# Patient Record
Sex: Male | Born: 1980 | Race: Black or African American | Hispanic: No | Marital: Single | Smoking: Former smoker
Health system: Southern US, Community
[De-identification: ages and names within clinical notes are randomized; demographics above are authoritative.]

## PROBLEM LIST (undated history)

## (undated) DIAGNOSIS — F1290 Cannabis use, unspecified, uncomplicated: Secondary | ICD-10-CM

## (undated) DIAGNOSIS — Z720 Tobacco use: Secondary | ICD-10-CM

## (undated) DIAGNOSIS — Z87891 Personal history of nicotine dependence: Secondary | ICD-10-CM

## (undated) DIAGNOSIS — I10 Essential (primary) hypertension: Secondary | ICD-10-CM

## (undated) HISTORY — PX: WISDOM TOOTH EXTRACTION: SHX21

## (undated) HISTORY — DX: Personal history of nicotine dependence: Z87.891

## (undated) HISTORY — DX: Tobacco use: Z72.0

## (undated) HISTORY — DX: Cannabis use, unspecified, uncomplicated: F12.90

---
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
## 2000-12-24 ENCOUNTER — Emergency Department (HOSPITAL_COMMUNITY): Admission: EM | Admit: 2000-12-24 | Discharge: 2000-12-24 | Payer: Self-pay | Admitting: Emergency Medicine

## 2000-12-24 ENCOUNTER — Encounter: Payer: Self-pay | Admitting: Emergency Medicine

## 2002-02-03 ENCOUNTER — Emergency Department (HOSPITAL_COMMUNITY): Admission: EM | Admit: 2002-02-03 | Discharge: 2002-02-03 | Payer: Self-pay | Admitting: Emergency Medicine

## 2004-02-03 ENCOUNTER — Emergency Department (HOSPITAL_COMMUNITY): Admission: EM | Admit: 2004-02-03 | Discharge: 2004-02-03 | Payer: Self-pay | Admitting: *Deleted

## 2004-02-03 ENCOUNTER — Emergency Department (HOSPITAL_COMMUNITY): Admission: EM | Admit: 2004-02-03 | Discharge: 2004-02-03 | Payer: Self-pay | Admitting: Emergency Medicine

## 2004-07-31 ENCOUNTER — Emergency Department (HOSPITAL_COMMUNITY): Admission: EM | Admit: 2004-07-31 | Discharge: 2004-07-31 | Payer: Self-pay | Admitting: Emergency Medicine

## 2004-12-28 ENCOUNTER — Emergency Department (HOSPITAL_COMMUNITY): Admission: EM | Admit: 2004-12-28 | Discharge: 2004-12-28 | Payer: Self-pay | Admitting: Emergency Medicine

## 2006-04-09 ENCOUNTER — Emergency Department (HOSPITAL_COMMUNITY): Admission: EM | Admit: 2006-04-09 | Discharge: 2006-04-09 | Payer: Self-pay | Admitting: Emergency Medicine

## 2006-04-10 ENCOUNTER — Emergency Department (HOSPITAL_COMMUNITY): Admission: EM | Admit: 2006-04-10 | Discharge: 2006-04-10 | Payer: Self-pay | Admitting: Emergency Medicine

## 2006-11-14 ENCOUNTER — Emergency Department (HOSPITAL_COMMUNITY): Admission: EM | Admit: 2006-11-14 | Discharge: 2006-11-14 | Payer: Self-pay | Admitting: Emergency Medicine

## 2010-04-14 ENCOUNTER — Emergency Department (HOSPITAL_COMMUNITY)
Admission: EM | Admit: 2010-04-14 | Discharge: 2010-04-14 | Payer: Self-pay | Source: Home / Self Care | Admitting: Emergency Medicine

## 2010-06-13 ENCOUNTER — Inpatient Hospital Stay (INDEPENDENT_AMBULATORY_CARE_PROVIDER_SITE_OTHER)
Admission: RE | Admit: 2010-06-13 | Discharge: 2010-06-13 | Disposition: A | Discharge status: Home / Self Care | Payer: BC Managed Care – PPO | Source: Ambulatory Visit | Attending: Family Medicine | Admitting: Family Medicine

## 2010-06-13 DIAGNOSIS — R071 Chest pain on breathing: Secondary | ICD-10-CM

## 2011-05-14 ENCOUNTER — Encounter (HOSPITAL_COMMUNITY): Payer: Self-pay

## 2011-05-14 ENCOUNTER — Emergency Department (INDEPENDENT_AMBULATORY_CARE_PROVIDER_SITE_OTHER)
Admission: EM | Admit: 2011-05-14 | Discharge: 2011-05-14 | Disposition: A | Discharge status: Home Health | Payer: BC Managed Care – PPO | Source: Home / Self Care | Attending: Family Medicine | Admitting: Family Medicine

## 2011-05-14 DIAGNOSIS — J069 Acute upper respiratory infection, unspecified: Secondary | ICD-10-CM

## 2011-05-14 NOTE — ED Notes (Signed)
C/o nasal congestion, productive cough of yellow sputum and sore throat.  Sx started on 05/12/11.  Denies fever, states needs note to return to work.

## 2011-05-14 NOTE — Discharge Instructions (Signed)
Your exam was unremarkable for any bacterial infection. This is likely viral. I recommend aggressive fever control with acetaminophen (Tylenol) and/or ibuprofen. You may use these together, alternating them every 4 hours, or individually, every 8 hours. For example, take acetaminophen 500 to 1000 mg at 12 noon, then 600 to 800 mg of ibuprofen at 4 pm, then acetaminophen at 8 pm, etc. Also, stay hydrated with clear liquids. Return to care should your symptoms not improve, or worsen in any way.

## 2011-05-14 NOTE — ED Provider Notes (Signed)
History     CSN: [id number]  Arrival date & time 05/14/11  1191   First MD Initiated Contact with Patient 05/14/11 [phone]      Chief Complaint  Patient presents with  . Cough  . Nasal Congestion  . Sore Throat    (Consider location/radiation/quality/duration/timing/severity/associated sxs/prior treatment) HPI Comments: Pistol presents for evaluation of nasal congestion, productive cough, and scratchy throat. He denies any fever. He has not taken any medications. He states that he needs a note for work.  Patient is a 31 y.o. male presenting with cough and pharyngitis. The history is provided by the patient.  Cough This is a new problem. The current episode started more than 2 days ago. The problem occurs constantly. The cough is productive of sputum. There has been no fever. Associated symptoms include sore throat. He is a smoker.  Sore Throat    History reviewed. No pertinent past medical history.  History reviewed. No pertinent past surgical history.  No family history on file.  History  Substance Use Topics  . Smoking status: Not on file  . Smokeless tobacco: Not on file  . Alcohol Use: Yes      Review of Systems  Constitutional: Negative.   HENT: Positive for sore throat.   Eyes: Negative.   Respiratory: Positive for cough.   Cardiovascular: Negative.   Gastrointestinal: Negative.   Genitourinary: Negative.   Musculoskeletal: Negative.   Skin: Negative.   Neurological: Negative.     Allergies  Amoxicillin  Home Medications  No current outpatient prescriptions on file.  BP 125/75  Pulse 78  Temp(Src) 98.2 F (36.8 C) (Oral)  Resp 16  SpO2 98%  Physical Exam  Nursing note and vitals reviewed. Constitutional: He is oriented to person, place, and time. He appears well-developed and well-nourished.  HENT:  Head: Normocephalic and atraumatic.  Mouth/Throat: Uvula is midline, oropharynx is clear and moist and mucous membranes are normal.       TMs  obscured bilaterally with cerumen  Eyes: EOM are normal.  Neck: Normal range of motion.  Pulmonary/Chest: Effort normal and breath sounds normal. He has no decreased breath sounds. He has no wheezes. He has no rhonchi.  Musculoskeletal: Normal range of motion.  Neurological: He is alert and oriented to person, place, and time.  Skin: Skin is warm and dry.  Psychiatric: His behavior is normal.    ED Course  Procedures (including critical care time)  Labs Reviewed - No data to display No results found.   1. URI (upper respiratory infection)       MDM  Exam unremarkable; note to return to work on Friday, March 1, given        Richardo Priest, MD 05/14/11 [phone]

## 2011-08-17 ENCOUNTER — Emergency Department (HOSPITAL_COMMUNITY)
Admission: EM | Admit: 2011-08-17 | Discharge: 2011-08-17 | Disposition: A | Discharge status: Home / Self Care | Payer: BC Managed Care – PPO | Attending: Emergency Medicine | Admitting: Emergency Medicine

## 2011-08-17 ENCOUNTER — Encounter (HOSPITAL_COMMUNITY): Payer: Self-pay | Admitting: Emergency Medicine

## 2011-08-17 DIAGNOSIS — IMO0002 Reserved for concepts with insufficient information to code with codable children: Secondary | ICD-10-CM

## 2011-08-17 DIAGNOSIS — F17200 Nicotine dependence, unspecified, uncomplicated: Secondary | ICD-10-CM | POA: Insufficient documentation

## 2011-08-17 DIAGNOSIS — Z881 Allergy status to other antibiotic agents status: Secondary | ICD-10-CM | POA: Insufficient documentation

## 2011-08-17 MED ORDER — SULFAMETHOXAZOLE-TRIMETHOPRIM 800-160 MG PO TABS: 1.0000 | ORAL_TABLET | Freq: Two times a day (BID) | ORAL | Status: AC

## 2011-08-17 MED ORDER — HYDROCODONE-ACETAMINOPHEN 5-500 MG PO TABS: 1.0000 | ORAL_TABLET | Freq: Four times a day (QID) | ORAL | Status: AC | PRN

## 2011-08-17 NOTE — Discharge Instructions (Signed)
Fingertip Infection   When an infection is around the nail, it is called a paronychia. When it appears over the tip of the finger, it is called a felon. These infections are due to minor injuries or cracks in the skin. If they are not treated properly, they can lead to bone infection and permanent damage to the fingernail.   Incision and drainage is necessary if a pus pocket (an abscess) has formed. Antibiotics and pain medicine may also be needed. Keep your hand elevated for the next 2-3 days to reduce swelling and pain. If a pack was placed in the abscess, it should be removed in 1-2 days by your caregiver. Soak the finger in warm water for 20 minutes 4 times daily to help promote drainage.   Keep the hands as dry as possible. Wear protective gloves with cotton liners. See your caregiver for follow-up care as recommended.   HOME CARE INSTRUCTIONS   Keep wound clean, dry and dressed as suggested by your caregiver.   Soak in warm salt water for fifteen minutes, four times per day for bacterial infections.   Your caregiver will prescribe an antibiotic if a bacterial infection is suspected. Take antibiotics as directed and finish the prescription, even if the problem appears to be improving before the medicine is gone.   Only take over-the-counter or prescription medicines for pain, discomfort, or fever as directed by your caregiver.   SEEK IMMEDIATE MEDICAL CARE IF:   There is redness, swelling, or increasing pain in the wound.   Pus or any other unusual drainage is coming from the wound.   An unexplained oral temperature above 102 F (38.9 C) develops.   You notice a foul smell coming from the wound or dressing.   MAKE SURE YOU:   Understand these instructions.   Monitor your condition.   Contact your caregiver if you are getting worse or not improving.   Document Released: 04/09/2004 Document Revised: 02/19/2011 Document Reviewed: 04/05/2008   [employer] Patient Information 2012 [employer].

## 2011-08-17 NOTE — ED Provider Notes (Signed)
Medical screening examination/treatment/procedure(s) were performed by non-physician practitioner and as supervising physician I was immediately available for consultation/collaboration.   Lyanne Co, MD 08/17/11 [phone]

## 2011-08-17 NOTE — ED Provider Notes (Signed)
History     CSN: [id number]  Arrival date & time 08/17/11  1322   First MD Initiated Contact with Patient 08/17/11 [phone]      Chief Complaint  Patient presents with  . Hand Pain    (Consider location/radiation/quality/duration/timing/severity/associated sxs/prior treatment) HPI  Patient presents to the ED with complaints of right middle finger pain. He admits to biting his nails and states that over the past couple of days it has gotten, hot, swollen and painful. He has also noticed pus coming out of hit. No fever,s chills, nausea, vomiting, weakness.  History reviewed. No pertinent past medical history.  Past Surgical History  Procedure Date  . Wisdom tooth extraction     No family history on file.  History  Substance Use Topics  . Smoking status: Current Everyday Smoker -- 0.5 packs/day    Types: Cigarettes  . Smokeless tobacco: Not on file  . Alcohol Use: No      Review of Systems   HEENT: denies blurry vision or change in hearing PULMONARY: Denies difficulty breathing and SOB CARDIAC: denies chest pain or heart palpitations MUSCULOSKELETAL:  denies being unable to ambulate ABDOMEN AL: denies abdominal pain GU: denies loss of bowel or urinary control NEURO: denies numbness and tingling in extremities      Allergies  Amoxicillin  Home Medications   Current Outpatient Rx  Name Route Sig Dispense Refill  . ACETAMINOPHEN 325 MG PO TABS Oral Take 650 mg by mouth every 6 (six) hours as needed.    Marland Kitchen HYDROCODONE-ACETAMINOPHEN 5-500 MG PO TABS Oral Take 1 tablet by mouth every 6 (six) hours as needed for pain. 6 tablet 0  . SULFAMETHOXAZOLE-TRIMETHOPRIM 800-160 MG PO TABS Oral Take 1 tablet by mouth every 12 (twelve) hours. 10 tablet 0    BP 123/58  Pulse 77  Temp(Src) 97.5 F (36.4 C) (Oral)  Resp 14  SpO2 99%  Physical Exam  Nursing note and vitals reviewed. Constitutional: He appears well-developed and well-nourished. No distress.  HENT:  Head:  Normocephalic and atraumatic.  Eyes: Pupils are equal, round, and reactive to light.  Neck: Normal range of motion. Neck supple.  Cardiovascular: Normal rate and regular rhythm.   Pulmonary/Chest: Effort normal.  Abdominal: Soft.  Neurological: He is alert.  Skin: Skin is warm and dry.       Small felon to right hand middle finger. Without concerning signs of tendon/bone involvement    ED Course  Procedures (including critical care time)  Labs Reviewed - No data to display No results found.   1. Felon       MDM  INCISION AND DRAINAGE Performed by: Dorthula Matas Consent: Verbal consent obtained. Risks and benefits: risks, benefits and alternatives were discussed Type: abscess  Body area: right middle finger  Anesthesia: digital block  Local anesthetic: lidocaine 2 % wo epinephrine  Anesthetic total: 3 ml  Complexity: complex   Drainage: purulent  Drainage amount: large  Packing material: none  Patient tolerance: Patient tolerated the procedure well with no immediate complications.   Pt started on bactrim. Work note given and pt given 10 tabs of Vicodin for pain  Pt has been advised of the symptoms that warrant their return to the ED. Patient has voiced understanding and has agreed to follow-up with the PCP or specialist.         Dorthula Matas, PA 08/17/11 1610

## 2011-08-17 NOTE — ED Notes (Signed)
Pt presenting to ed with c/o possible ingrown finger nail to his right middle finger. Pt states he bites his finger nails. Pt states swelling noted to his middle finger yesterday with pus noted.

## 2012-05-18 ENCOUNTER — Encounter (HOSPITAL_COMMUNITY): Payer: Self-pay | Admitting: *Deleted

## 2012-05-18 ENCOUNTER — Emergency Department (INDEPENDENT_AMBULATORY_CARE_PROVIDER_SITE_OTHER): Payer: BC Managed Care – PPO

## 2012-05-18 ENCOUNTER — Emergency Department (HOSPITAL_COMMUNITY)
Admission: EM | Admit: 2012-05-18 | Discharge: 2012-05-18 | Disposition: A | Discharge status: Home / Self Care | Payer: BC Managed Care – PPO | Source: Home / Self Care

## 2012-05-18 DIAGNOSIS — M25519 Pain in unspecified shoulder: Secondary | ICD-10-CM

## 2012-05-18 MED ORDER — FAMOTIDINE 20 MG PO TABS: 20.0000 mg | ORAL_TABLET | Freq: Two times a day (BID) | ORAL | Status: DC

## 2012-05-18 MED ORDER — NAPROXEN 375 MG PO TABS: 375.0000 mg | ORAL_TABLET | Freq: Two times a day (BID) | ORAL | Status: DC

## 2012-05-18 MED ORDER — TROLAMINE SALICYLATE 10 % EX CREA: TOPICAL_CREAM | CUTANEOUS | Status: DC | PRN

## 2012-05-18 NOTE — ED Notes (Signed)
Pt  Reports  Pain l  Shoulder  Yesterday  He  denys  Any  Recent  Injury      He  Reports  He     Has  An  Old  Injury  To  Shoulder   Presumed  As  A  Sprain according to pt      He  denys  Any  Known  reinjury of that  Shoulder

## 2012-05-18 NOTE — ED Provider Notes (Addendum)
History     CSN: [id number]  Arrival date & time 05/18/12  1418   First MD Initiated Contact with Patient 05/18/12 1446      Chief Complaint  Patient presents with  . Shoulder Pain    (Consider location/radiation/quality/duration/timing/severity/associated sxs/prior treatment) Patient is a 32 y.o. male presenting with shoulder pain.  Shoulder Pain   This is a 32 year old male who injured his left shoulder about 5 years ago when he fell on it. He did not seek any medical attention at that time however he is noted that off and on over the years he has developed pain in the shoulder. Last episode of pain occurred 6 months ago. This current episode started about 2 days ago. He is having trouble raising his arm. He states that he does heavy lifting at work and this lifting is exacerbating his shoulder pain. He has not tried any medications or creams yet for the pain.   No past medical history on file.  Past Surgical History  Procedure Laterality Date  . Wisdom tooth extraction      No family history on file.  History  Substance Use Topics  . Smoking status: Current Every Day Smoker -- 0.50 packs/day    Types: Cigarettes  . Smokeless tobacco: Not on file  . Alcohol Use: No      Review of Systems  Constitutional: Negative.   HENT: Negative.   Respiratory: Negative.   Cardiovascular: Negative.   Genitourinary: Negative.   Musculoskeletal:       Pain around his left shoulder without any specific localization  Skin: Negative.   Neurological: Negative.   Hematological: Negative.   Psychiatric/Behavioral: Negative.     Allergies  Amoxicillin  Home Medications   Current Outpatient Rx  Name  Route  Sig  Dispense  Refill  . acetaminophen (TYLENOL) 325 MG tablet   Oral   Take 650 mg by mouth every 6 (six) hours as needed.         . famotidine (PEPCID) 20 MG tablet   Oral   Take 1 tablet (20 mg total) by mouth 2 (two) times daily.   60 tablet   0   . naproxen  (NAPROSYN) 375 MG tablet   Oral   Take 1 tablet (375 mg total) by mouth 2 (two) times daily with a meal.   60 tablet   0   . trolamine salicylate (ASPERCREME/ALOE) 10 % cream   Topical   Apply topically as needed.   85 g   0     BP 156/69  Pulse 84  Temp(Src) 98.8 F (37.1 C) (Oral)  SpO2 97%  Physical Exam  Constitutional: He is oriented to person, place, and time. He appears well-developed and well-nourished.  HENT:  Head: Normocephalic and atraumatic.  Eyes: Pupils are equal, round, and reactive to light.  Neck: Normal range of motion. Neck supple.  Cardiovascular: Normal rate and regular rhythm.   Pulmonary/Chest: Effort normal and breath sounds normal.  Abdominal: Soft. Bowel sounds are normal.  Musculoskeletal: He exhibits tenderness.  Tenderness around the upper outer aspect of left shoulder anteriorly and posteriorly. Elevating the arm greater than 90 causes pain. Patient is able to elevate his arm above his head although it does exacerbate his pain. Flexing his arm behind his back also causes pain but he is able to complete the movement. No swelling or skin changes noted.  Neurological: He is alert and oriented to person, place, and time.  Skin: Skin is warm  and dry.  Psychiatric: He has a normal mood and affect.    ED Course  Procedures (including critical care time)  Labs Reviewed - No data to display Dg Shoulder Left  05/18/2012  *RADIOLOGY REPORT*  Clinical Data: Shoulder pain anteriorly, fell 5 years ago landing on shoulder  LEFT SHOULDER - 2+ VIEW  Comparison: None  Findings: Osseous mineralization normal. AC joint alignment normal. No acute fracture, dislocation or bone destruction. Visualized left ribs intact.  IMPRESSION: Normal exam.   Original Report Authenticated By: Ulyses Southward, M.D.      1. Shoulder pain, acute, left       MDM  Naprosyn 375 twice a day, Aspercreme 10% twice a day, Pepcid and Naprosyn results in GI upset, ice and rest. I have  given him a work excuse which limits him from using the left arm completely for one week after which he should do light duty only for another week.  X-ray performed today does not reveal any bony abnormalities.      Calvert Cantor, MD 05/18/12 1648  Calvert Cantor, MD 05/18/12 1610  Calvert Cantor, MD 05/18/12 1650

## 2012-05-25 NOTE — ED Notes (Signed)
Pt presented with copy of note from MD , stated his employer does not have limited duty, and needs RTW note for tomorrow

## 2017-10-13 ENCOUNTER — Emergency Department (HOSPITAL_COMMUNITY)
Admission: EM | Admit: 2017-10-13 | Discharge: 2017-10-13 | Disposition: A | Discharge status: Home / Self Care | Payer: Self-pay | Attending: Emergency Medicine | Admitting: Emergency Medicine

## 2017-10-13 ENCOUNTER — Encounter (HOSPITAL_COMMUNITY): Payer: Self-pay

## 2017-10-13 ENCOUNTER — Emergency Department (HOSPITAL_COMMUNITY): Payer: Self-pay

## 2017-10-13 DIAGNOSIS — F17210 Nicotine dependence, cigarettes, uncomplicated: Secondary | ICD-10-CM | POA: Insufficient documentation

## 2017-10-13 DIAGNOSIS — Z79899 Other long term (current) drug therapy: Secondary | ICD-10-CM | POA: Insufficient documentation

## 2017-10-13 DIAGNOSIS — K529 Noninfective gastroenteritis and colitis, unspecified: Secondary | ICD-10-CM

## 2017-10-13 DIAGNOSIS — K5289 Other specified noninfective gastroenteritis and colitis: Secondary | ICD-10-CM | POA: Insufficient documentation

## 2017-10-13 LAB — CBC
HCT: 43.9 % (ref 39.0–52.0)
Hemoglobin: 14.3 g/dL (ref 13.0–17.0)
MCH: 26.7 pg (ref 26.0–34.0)
MCHC: 32.6 g/dL (ref 30.0–36.0)
MCV: 82.1 fL (ref 78.0–100.0)
Platelets: 193 10*3/uL (ref 150–400)
RBC: 5.35 MIL/uL (ref 4.22–5.81)
RDW: 14.8 % (ref 11.5–15.5)
WBC: 9.8 10*3/uL (ref 4.0–10.5)

## 2017-10-13 LAB — COMPREHENSIVE METABOLIC PANEL
ALBUMIN: 4.4 g/dL (ref 3.5–5.0)
ALT: 12 U/L (ref 0–44)
AST: 26 U/L (ref 15–41)
Alkaline Phosphatase: 62 U/L (ref 38–126)
Anion gap: 13 (ref 5–15)
BILIRUBIN TOTAL: 0.6 mg/dL (ref 0.3–1.2)
BUN: 10 mg/dL (ref 6–20)
CALCIUM: 9.7 mg/dL (ref 8.9–10.3)
CHLORIDE: 104 mmol/L (ref 98–111)
CO2: 21 mmol/L — ABNORMAL LOW (ref 22–32)
CREATININE: 1.32 mg/dL — AB (ref 0.61–1.24)
GFR calc Af Amer: >60 mL/min (ref >60)
Glucose, Bld: 113 mg/dL — ABNORMAL HIGH (ref 70–99)
Potassium: 3.5 mmol/L (ref 3.5–5.1)
Sodium: 138 mmol/L (ref 135–145)
TOTAL PROTEIN: 7.3 g/dL (ref 6.5–8.1)

## 2017-10-13 LAB — URINALYSIS, ROUTINE W REFLEX MICROSCOPIC
BILIRUBIN URINE: NEGATIVE
Glucose, UA: NEGATIVE mg/dL
HGB URINE DIPSTICK: NEGATIVE
Ketones, ur: 15 mg/dL — AB
Leukocytes, UA: NEGATIVE
NITRITE: NEGATIVE
PH: 7.5 (ref 5.0–8.0)
Protein, ur: NEGATIVE mg/dL
Specific Gravity, Urine: 1.015 (ref 1.005–1.030)

## 2017-10-13 LAB — LIPASE, BLOOD: Lipase: 33 U/L (ref 11–51)

## 2017-10-13 LAB — I-STAT CG4 LACTIC ACID, ED: LACTIC ACID, VENOUS: 1.86 mmol/L (ref 0.5–1.9)

## 2017-10-13 MED ORDER — MORPHINE SULFATE (PF) 4 MG/ML IV SOLN
4.0000 mg | Freq: Once | INTRAVENOUS | Status: AC
  Administered 2017-10-13: 4 mg via INTRAVENOUS
  Filled 2017-10-13: qty 1

## 2017-10-13 MED ORDER — SODIUM CHLORIDE 0.9 % IV BOLUS
1000.0000 mL | Freq: Once | INTRAVENOUS | Status: AC
  Administered 2017-10-13: 1000 mL via INTRAVENOUS

## 2017-10-13 MED ORDER — ONDANSETRON 4 MG PO TBDP: 4.0000 mg | ORAL_TABLET | Freq: Three times a day (TID) | ORAL | 0 refills | Status: DC | PRN

## 2017-10-13 MED ORDER — IOHEXOL 300 MG/ML  SOLN
100.0000 mL | Freq: Once | INTRAMUSCULAR | Status: AC | PRN
  Administered 2017-10-13: 100 mL via INTRAVENOUS

## 2017-10-13 MED ORDER — ONDANSETRON HCL 4 MG/2ML IJ SOLN
4.0000 mg | Freq: Once | INTRAMUSCULAR | Status: AC
  Administered 2017-10-13: 4 mg via INTRAVENOUS
  Filled 2017-10-13: qty 2

## 2017-10-13 NOTE — ED Notes (Signed)
Pt ambulatory to restroom with steady gait.

## 2017-10-13 NOTE — ED Provider Notes (Signed)
[facility] EMERGENCY DEPARTMENT Provider Note   CSN: [id number] Arrival date & time: 10/13/17  6295     History   Chief Complaint Chief Complaint  Patient presents with  . Abdominal Pain    HPI James Fox is a 37 y.o. male with a history of nephrolithiasis who presents to the emergency department with a chief complaint of abdominal pain.  The patient endorses sudden onset, constant generalized abdominal pain that began at 1:30 AM.  He is unable to characterize the pain.  He was unable to get to sleep due to the pain.  Pain is worse with laying flat.  No known alleviating factors.  He reports associated nausea, 7-8 episodes of non-bloody, non-bilious vomiting, diaphoresis, and chills.  He states that he feels constipated, last bowel movement was yesterday.  He denies fever, diarrhea, hematuria, dysuria, penile discharge, hematemesis, chest pain, dyspnea, back pain, melena, or hematochezia.   No history of abdominal surgery.  The patient reports that he passed a kidney stone approximately 1 month ago.  No treatment prior to arrival.  No known sick contacts.  No recent travel.  He smokes both cigarettes and marijuana daily.  Infrequent alcohol use.  He has a history of cocaine use, but no recent use.  He denies other recreational or IV drugs use.   The history is provided by the patient. No language interpreter was used.    History reviewed. No pertinent past medical history.  There are no active problems to display for this patient.   Past Surgical History:  Procedure Laterality Date  . WISDOM TOOTH EXTRACTION          Home Medications    Prior to Admission medications   Medication Sig Start Date End Date Taking? Authorizing Provider  ondansetron (ZOFRAN ODT) 4 MG disintegrating tablet Take 1 tablet (4 mg total) by mouth every 8 (eight) hours as needed for nausea or vomiting. 10/13/17   Kaelei Wheeler A, PA-C    Family History No family history on  file.  Social History Social History   Tobacco Use  . Smoking status: Current Every Day Smoker    Packs/day: 0.50    Types: Cigarettes  . Smokeless tobacco: Never Used  Substance Use Topics  . Alcohol use: No  . Drug use: Yes    Types: Marijuana     Allergies   Amoxicillin   Review of Systems Review of Systems  Constitutional: Positive for chills and diaphoresis. Negative for appetite change and fever.  Respiratory: Negative for shortness of breath.   Cardiovascular: Negative for chest pain.  Gastrointestinal: Positive for abdominal pain, constipation, nausea and vomiting. Negative for anal bleeding, blood in stool and diarrhea.  Genitourinary: Negative for dysuria, flank pain, hematuria and urgency.  Musculoskeletal: Negative for back pain.  Skin: Negative for rash.  Allergic/Immunologic: Negative for immunocompromised state.  Psychiatric/Behavioral: Negative for confusion.     Physical Exam Updated Vital Signs BP (!) 146/91   Pulse 60   Temp (!) 97.5 F (36.4 C) (Oral)   Resp 11   SpO2 99%   Physical Exam  Constitutional: He appears well-developed.  Diaphoretic and uncomfortable appearing. Writhing around on the bed.   HENT:  Head: Normocephalic.  Eyes: Conjunctivae are normal.  Neck: Neck supple.  Cardiovascular: Normal rate, regular rhythm, normal heart sounds and intact distal pulses. Exam reveals no gallop and no friction rub.  No murmur heard. Pulmonary/Chest: Effort normal and breath sounds normal. No stridor. No respiratory distress.  He has no wheezes. He has no rales. He exhibits no tenderness.  Abdominal: Soft. He exhibits no distension and no mass. There is tenderness. There is rebound and guarding. No hernia.  Hypoactive bowel sounds in all 4 quadrants.  Diffusely tender throughout the abdomen, but more focally tender in the right lower quadrant. Positive Rosving's sign.  No rebound and minimal guarding.  Negative Murphy sign.  No CVA tenderness  bilaterally. Abdomen is soft, non-distended.  Neurological: He is alert.  Skin: Skin is warm and dry.  Psychiatric: His behavior is normal.  Nursing note and vitals reviewed.    ED Treatments / Results  Labs (all labs ordered are listed, but only abnormal results are displayed) Labs Reviewed  COMPREHENSIVE METABOLIC PANEL - Abnormal; Notable for the following components:      Result Value   CO2 21 (*)    Glucose, Bld 113 (*)    Creatinine, Ser 1.32 (*)    All other components within normal limits  URINALYSIS, ROUTINE W REFLEX MICROSCOPIC - Abnormal; Notable for the following components:   Ketones, ur 15 (*)    All other components within normal limits  LIPASE, BLOOD  CBC  I-STAT CG4 LACTIC ACID, ED    EKG None  Radiology Ct Abdomen Pelvis W Contrast  Result Date: 10/13/2017 CLINICAL DATA:  37 year old male with generalized abdominal pain and multiple episodes of nausea and vomiting. EXAM: CT ABDOMEN AND PELVIS WITH CONTRAST TECHNIQUE: Multidetector CT imaging of the abdomen and pelvis was performed using the standard protocol following bolus administration of intravenous contrast. CONTRAST:  100mL OMNIPAQUE IOHEXOL 300 MG/ML  SOLN COMPARISON:  None. FINDINGS: Lower chest: The lung bases are clear. Visualized cardiac structures are within normal limits for size. No pericardial effusion. Unremarkable visualized distal thoracic esophagus. Hepatobiliary: Normal hepatic contour and morphology. No discrete hepatic lesions. Normal appearance of the gallbladder. No intra or extrahepatic biliary ductal dilatation. Pancreas: Unremarkable. No pancreatic ductal dilatation or surrounding inflammatory changes. Spleen: Normal in size without focal abnormality. Adrenals/Urinary Tract: Normal adrenal glands. No evidence of hydronephrosis or nephrolithiasis. No enhancing renal mass. The bilateral ureters and bladder are unremarkable. Stomach/Bowel: Normal appendix in the right lower quadrant. The  terminal ileum is unremarkable. The colon is under distended. No evidence of obstruction. There is a loop of jejunum in the mid epigastric region that demonstrates circumferential wall thickening up to 1 cm. There is very subtle interstitial stranding in the adjacent omentum and mesenteric fat. Vascular/Lymphatic: No significant vascular findings are present. No enlarged abdominal or pelvic lymph nodes. Reproductive: Prostate is unremarkable. Other: No abdominal wall hernia or abnormality. No abdominopelvic ascites. Musculoskeletal: No acute fracture or aggressive appearing lytic or blastic osseous lesion. IMPRESSION: 1. A loop of jejunum in the mid epigastrium/right upper quadrant demonstrates circumferential wall thickening and there is a small amount of adjacent inflammatory stranding in the omentum. While nonspecific, findings are most suggestive of an infectious/inflammatory enteritis with infectious gastroenteritis statistically the most likely diagnosis. 2. Otherwise, unremarkable CT scan of the abdomen and pelvis. Electronically Signed   By: Malachy MoanHeath  McCullough M.D.   On: 10/13/2017 09:06    Procedures Procedures (including critical care time)  Medications Ordered in ED Medications  ondansetron [facility](ZOFRAN) injection 4 mg (4 mg Intravenous Given 10/13/17 0628)  morphine 4 MG/ML injection 4 mg (4 mg Intravenous Given 10/13/17 0628)  sodium chloride 0.9 % bolus 1,000 mL (0 mLs Intravenous Stopped 10/13/17 1011)  sodium chloride 0.9 % bolus 1,000 mL (0 mLs Intravenous Stopped  10/13/17 0931)  iohexol (OMNIPAQUE) 300 MG/ML solution 100 mL (100 mLs Intravenous Contrast Given 10/13/17 0815)     Initial Impression / Assessment and Plan / ED Course  I have reviewed the triage vital signs and the nursing notes.  Pertinent labs & imaging results that were available during my care of the patient were reviewed by me and considered in my medical decision making (see chart for details).  37 year old male with a  history of nephrolithiasis presenting with a sudden onset nausea, vomiting, and abdominal pain.  Labs are notable for mild ketonuria and his UA, creatinine of 1.32 likely secondary to hypovolemia.  On exam, he is diffusely tender to palpation throughout the abdomen, but more focally tender in the right lower quadrant.  Will order CT abdomen pelvis to assess for appendicitis.  Clinical Course as of Oct 13 1652  Wed Oct 13, 2017  0711 Patient recheck.  He is no longer writhing on the bed.  He appears much more comfortable, but continues to be diaphoretic.  SaO2 on the monitor is 97 to 99% with good waveform on room air.  On reexamination, the patient endorses mild discomfort with tenderness to palpation of the right CVA. no left CVA tenderness.  He has mild tenderness to palpation throughout the abdomen, but more focally in the right lower quadrant.  He is tender over McBurney's point.  No rebound or guarding.  Second IV fluid bolus ordered.   [MM]    Clinical Course User Index [MM] Eddie Payette A, PA-C    CT abdomen pelvis demonstrating circumferential wall thickening suggestive of infectious inflammatory enteritis with infectious gastroenteritis most likely.  These findings were discussed with the patient.  All questions answered by him and his wife.  He reports that he feels significantly better.  He was successfully fluid challenged.  Will discharge the patient to home with symptomatic treatment.  Recommended the patient get established with primary care in the outpatient setting.  Strict return precautions given.  He is hemodynamically stable and in no acute distress.  He is safe for discharge to home with outpatient follow-up at this time.  Final Clinical Impressions(s) / ED Diagnoses   Final diagnoses:  Gastroenteritis    ED Discharge Orders        Ordered    ondansetron (ZOFRAN ODT) 4 MG disintegrating tablet  Every 8 hours PRN     10/13/17 1010       Juanluis Guastella A,  PA-C 10/13/17 1654    Glynn Octave, MD 10/13/17 2017

## 2017-10-13 NOTE — ED Notes (Signed)
Patient transported to CT 

## 2017-10-13 NOTE — Discharge Instructions (Addendum)
Thank you for allowing me to care for you today in the Emergency Department.   1 tablet of Zofran dissolve under tongue every 8 hours as needed for nausea or vomiting.  Take 600 mg of ibuprofen with food or 650 mg of Tylenol every 6 hours for pain.  Make sure to wash your hands with warm water and soap frequently until your symptoms resolve.  I would also recommend bleaching your bathroom and cleaning it thoroughly to avoid spread of infection.  Continue to drink plenty of water until your symptoms resolve so they do not get dehydrated.  Most symptoms significantly improve in 48 to 72 hours.  MiraLAX is available over-the-counter and can help with constipation.  Also drinking more water and adding more fiber into your diet can help to be more regular with your stools. -For remainder of constipation clean out, take 2 capfuls of Miralax by mouth mixed with 16 ounces of juice or gatorade once. -After constipation clean out, take 1 capful of Miralax by mouth daily mixed with 16 ounces of juice or gatrorade -If you experience diarrhea, you may decreased the daily dose by 1/2 capful as needed.  Return to the emergency department if you develop persistent vomiting despite taking Zofran, high fever despite taking Tylenol or ibuprofen, if you notice blood in your vomit or in your stool, or other new, concerning symptoms.  You are cleared to return to work once he has not had any vomiting or diarrhea for at least 24 hours and have been fever free for 24 hours.

## 2017-10-13 NOTE — ED Notes (Signed)
Gave pt two cans of soda

## 2017-10-13 NOTE — ED Notes (Signed)
Patient verbalizes understanding of discharge instructions. Opportunity for questioning and answers were provided. Armband removed by staff, pt discharged from ED.  

## 2017-10-13 NOTE — ED Triage Notes (Signed)
Pt states that he began having generalized abd pain tonight with n/v x 4, denies diarrhea or fevers

## 2017-11-18 ENCOUNTER — Encounter (HOSPITAL_COMMUNITY): Payer: Self-pay | Admitting: Emergency Medicine

## 2017-11-18 ENCOUNTER — Emergency Department (HOSPITAL_COMMUNITY)
Admission: EM | Admit: 2017-11-18 | Discharge: 2017-11-18 | Disposition: A | Discharge status: Home / Self Care | Payer: Self-pay | Attending: Emergency Medicine | Admitting: Emergency Medicine

## 2017-11-18 DIAGNOSIS — F17210 Nicotine dependence, cigarettes, uncomplicated: Secondary | ICD-10-CM | POA: Insufficient documentation

## 2017-11-18 DIAGNOSIS — S61210A Laceration without foreign body of right index finger without damage to nail, initial encounter: Secondary | ICD-10-CM | POA: Insufficient documentation

## 2017-11-18 DIAGNOSIS — Y939 Activity, unspecified: Secondary | ICD-10-CM | POA: Insufficient documentation

## 2017-11-18 DIAGNOSIS — Y990 Civilian activity done for income or pay: Secondary | ICD-10-CM | POA: Insufficient documentation

## 2017-11-18 DIAGNOSIS — Y929 Unspecified place or not applicable: Secondary | ICD-10-CM | POA: Insufficient documentation

## 2017-11-18 DIAGNOSIS — W268XXA Contact with other sharp object(s), not elsewhere classified, initial encounter: Secondary | ICD-10-CM | POA: Insufficient documentation

## 2017-11-18 DIAGNOSIS — Z23 Encounter for immunization: Secondary | ICD-10-CM | POA: Insufficient documentation

## 2017-11-18 MED ORDER — TETANUS-DIPHTH-ACELL PERTUSSIS 5-2.5-18.5 LF-MCG/0.5 IM SUSP
0.5000 mL | Freq: Once | INTRAMUSCULAR | Status: AC
  Administered 2017-11-18: 0.5 mL via INTRAMUSCULAR
  Filled 2017-11-18: qty 0.5

## 2017-11-18 MED ORDER — BACITRACIN ZINC 500 UNIT/GM EX OINT: TOPICAL_OINTMENT | Freq: Two times a day (BID) | CUTANEOUS | Status: DC

## 2017-11-18 NOTE — ED Provider Notes (Signed)
  [facility] EMERGENCY DEPARTMENT Provider Note   CSN: [id number] Arrival date & time: 11/18/17  0342     History   Chief Complaint Chief Complaint  Patient presents with  . Finger Laceration    HPI James Fox is a 37 y.o. male.  Patient presents with laceration to right index finger that happened yesterday afternoon while working with metal. No other injury.   The history is provided by the patient.    History reviewed. No pertinent past medical history.  There are no active problems to display for this patient.   Past Surgical History:  Procedure Laterality Date  . WISDOM TOOTH EXTRACTION          Home Medications    Prior to Admission medications   Medication Sig Start Date End Date Taking? Authorizing Provider  ondansetron (ZOFRAN ODT) 4 MG disintegrating tablet Take 1 tablet (4 mg total) by mouth every 8 (eight) hours as needed for nausea or vomiting. 10/13/17   McDonald, Mia A, PA-C    Family History No family history on file.  Social History Social History   Tobacco Use  . Smoking status: Current Every Day Smoker    Packs/day: 0.50    Types: Cigarettes  . Smokeless tobacco: Never Used  Substance Use Topics  . Alcohol use: No  . Drug use: Yes    Types: Marijuana     Allergies   Amoxicillin   Review of Systems Review of Systems  Constitutional: Negative for fever.  Musculoskeletal:       See HPI.  Skin: Positive for wound.  Neurological: Negative for numbness.     Physical Exam Updated Vital Signs BP 133/77 (BP Location: Left Arm)   Pulse 92   Temp 98.1 F (36.7 C) (Oral)   Resp 16   Ht 5\' 9"  (1.753 m)   Wt 99.8 kg   SpO2 99%   BMI 32.49 kg/m   Physical Exam  Constitutional: He appears well-developed and well-nourished. No distress.  Musculoskeletal:  FROM right index finger.   Neurological: No sensory deficit.  Skin:  1 cm partial thickness laceration to distal phalanx right index finger.  Contaminated with debris. No swelling of finger.      ED Treatments / Results  Labs (all labs ordered are listed, but only abnormal results are displayed) Labs Reviewed - No data to display  EKG None  Radiology No results found.  Procedures Procedures (including critical care time)  Medications Ordered in ED Medications - No data to display   Initial Impression / Assessment and Plan / ED Course  I have reviewed the triage vital signs and the nursing notes.  Pertinent labs & imaging results that were available during my care of the patient were reviewed by me and considered in my medical decision making (see chart for details).     Patient presents with partial thickness, aged wound. Soaked the finger and cleaned of all debris. No sutures due to delayed presentation. Wound care instructions discussed. Tetanus updated.   Final Clinical Impressions(s) / ED Diagnoses   Final diagnoses:  None   1. Laceration right index finger  ED Discharge Orders    None       Elpidio Anis, PA-C 11/18/17 0541    Derwood Kaplan, MD 11/18/17 [phone]

## 2017-11-18 NOTE — ED Notes (Signed)
The pt is c/o a laceration to the distal tip of his rt index finger  He did 12 hours ago on a piece of rubber  No active bleeding  Pt sleeping

## 2017-11-18 NOTE — ED Triage Notes (Signed)
Patient accidentally hit his right distal index finger against a metal while at work yesterday , presents with approx. 1/4" laceration with no bleeding ( dried blood) .

## 2017-11-18 NOTE — ED Notes (Signed)
Pt soaking his finger  Very dirty

## 2018-08-25 ENCOUNTER — Emergency Department (HOSPITAL_COMMUNITY): Payer: Self-pay

## 2018-08-25 ENCOUNTER — Encounter (HOSPITAL_COMMUNITY): Payer: Self-pay

## 2018-08-25 ENCOUNTER — Emergency Department (HOSPITAL_COMMUNITY)
Admission: EM | Admit: 2018-08-25 | Discharge: 2018-08-25 | Disposition: A | Discharge status: Home / Self Care | Payer: Self-pay | Attending: Emergency Medicine | Admitting: Emergency Medicine

## 2018-08-25 DIAGNOSIS — K529 Noninfective gastroenteritis and colitis, unspecified: Secondary | ICD-10-CM | POA: Insufficient documentation

## 2018-08-25 DIAGNOSIS — F17210 Nicotine dependence, cigarettes, uncomplicated: Secondary | ICD-10-CM | POA: Insufficient documentation

## 2018-08-25 LAB — COMPREHENSIVE METABOLIC PANEL
ALT: 18 U/L (ref 0–44)
AST: 26 U/L (ref 15–41)
Albumin: 4.5 g/dL (ref 3.5–5.0)
Alkaline Phosphatase: 55 U/L (ref 38–126)
Anion gap: 10 (ref 5–15)
BUN: 14 mg/dL (ref 6–20)
CO2: 20 mmol/L — ABNORMAL LOW (ref 22–32)
Calcium: 9.8 mg/dL (ref 8.9–10.3)
Chloride: 108 mmol/L (ref 98–111)
Creatinine, Ser: 0.9 mg/dL (ref 0.61–1.24)
GFR calc Af Amer: >60 mL/min (ref >60)
GFR calc non Af Amer: >60 mL/min (ref >60)
Glucose, Bld: 127 mg/dL — ABNORMAL HIGH (ref 70–99)
Potassium: 4.1 mmol/L (ref 3.5–5.1)
Sodium: 138 mmol/L (ref 135–145)
Total Bilirubin: 0.7 mg/dL (ref 0.3–1.2)
Total Protein: 7.5 g/dL (ref 6.5–8.1)

## 2018-08-25 LAB — URINALYSIS, ROUTINE W REFLEX MICROSCOPIC
Bacteria, UA: NONE SEEN
Bilirubin Urine: NEGATIVE
Glucose, UA: NEGATIVE mg/dL
Hgb urine dipstick: NEGATIVE
Ketones, ur: 20 mg/dL — AB
Leukocytes,Ua: NEGATIVE
Nitrite: NEGATIVE
Protein, ur: 30 mg/dL — AB
Specific Gravity, Urine: 1.021 (ref 1.005–1.030)
pH: 9 — ABNORMAL HIGH (ref 5.0–8.0)

## 2018-08-25 LAB — CBC
HCT: 44.3 % (ref 39.0–52.0)
Hemoglobin: 14.6 g/dL (ref 13.0–17.0)
MCH: 27.7 pg (ref 26.0–34.0)
MCHC: 33 g/dL (ref 30.0–36.0)
MCV: 83.9 fL (ref 80.0–100.0)
Platelets: 146 10*3/uL — ABNORMAL LOW (ref 150–400)
RBC: 5.28 MIL/uL (ref 4.22–5.81)
RDW: 14.8 % (ref 11.5–15.5)
WBC: 9.3 10*3/uL (ref 4.0–10.5)
nRBC: 0 % (ref 0.0–0.2)

## 2018-08-25 LAB — LIPASE, BLOOD: Lipase: 19 U/L (ref 11–51)

## 2018-08-25 MED ORDER — MORPHINE SULFATE (PF) 4 MG/ML IV SOLN
4.0000 mg | Freq: Once | INTRAVENOUS | Status: AC
  Administered 2018-08-25: 4 mg via INTRAVENOUS
  Filled 2018-08-25: qty 1

## 2018-08-25 MED ORDER — PROMETHAZINE HCL 25 MG/ML IJ SOLN
25.0000 mg | Freq: Once | INTRAMUSCULAR | Status: AC
  Administered 2018-08-25: 25 mg via INTRAVENOUS
  Filled 2018-08-25: qty 1

## 2018-08-25 MED ORDER — SODIUM CHLORIDE 0.9% FLUSH: 3.0000 mL | Freq: Once | INTRAVENOUS | Status: DC

## 2018-08-25 MED ORDER — SODIUM CHLORIDE 0.9 % IV BOLUS
1000.0000 mL | Freq: Once | INTRAVENOUS | Status: AC
  Administered 2018-08-25: 1000 mL via INTRAVENOUS

## 2018-08-25 MED ORDER — PROMETHAZINE HCL 25 MG PO TABS: 25.0000 mg | ORAL_TABLET | Freq: Four times a day (QID) | ORAL | 0 refills | Status: DC | PRN

## 2018-08-25 NOTE — ED Triage Notes (Signed)
Patient arrived via [facility] from home.   C/O abdominal pain an diarrhea that has been going on since 2:30 am.   Vomitting yellow bile.   18g left AC 4mg  of zofran with "little" relief. Given per ems.   A/ox4

## 2018-08-25 NOTE — ED Provider Notes (Signed)
[facility] at [facility] change please see his note for more details of visit.  In short patient here with nausea vomiting and diarrhea that began this morning with some abdominal discomfort.  Abdominal tenderness on examination generalized.  CBC nonacute CMP nonacute Lipase within normal limits CT renal:  IMPRESSION:  1. A cause for patient's symptoms has not been established with this  study.    2. No appreciable bowel obstruction. No abscess evident in the  abdomen or pelvis. Appendix appears within normal limits.    3. No demonstrable renal or ureteral calculus. No hydronephrosis.  Urinary bladder wall thickness is normal. There is a small amount of  calcification in the prostate and seminal vesicles.    4. Minimal ventral hernia containing only fat.   Urinalysis with ketones and protein present likely secondary to dehydration  Patient has been given pain control, Phenergan and fluid bolus.  Plan of care at shift change was to p.o. challenge and then discharge patient with Phenergan with PCP follow-up.  Suspected gastroenteritis.  Physical Exam  BP (!) 141/98 (BP Location: Right Arm)   Pulse 61   Temp 98 F (36.7 C) (Oral)   Resp 16   SpO2 100%   Physical Exam Constitutional:      General: He is not in acute distress.    Appearance: Normal appearance. He is well-developed. He is not ill-appearing or diaphoretic.  HENT:     Head: Normocephalic and atraumatic.     Right Ear: External ear normal.     Left Ear: External ear normal.     Nose: Nose normal.  Eyes:     General: Vision grossly intact. Gaze aligned appropriately.     Pupils: Pupils are equal, round, and reactive to light.  Neck:     Musculoskeletal: Normal range of motion.     Trachea: Trachea and phonation normal. No tracheal deviation.  Pulmonary:     Effort: Pulmonary effort is normal. No respiratory distress.  Abdominal:     General: There is no distension.   Palpations: Abdomen is soft.     Tenderness: There is no abdominal tenderness. There is no guarding or rebound.  Musculoskeletal: Normal range of motion.  Skin:    General: Skin is warm and dry.  Neurological:     Mental Status: He is alert.     GCS: GCS eye subscore is 4. GCS verbal subscore is 5. GCS motor subscore is 6.     Comments: Speech is clear and goal oriented, follows commands Major Cranial nerves without deficit, no facial droop Moves extremities without ataxia, coordination intact  Psychiatric:        Behavior: Behavior normal.     ED Course/Procedures      Procedures  MDM  Patient reassessed resting comfortably no acute distress.  He is sleeping easily arousable to voice.  He reports improvement of his symptoms following medications today.  Agreeable to discharge.  Tolerating p.o. without difficulty.  As per previous team's plan he has been prescribed Phenergan as needed for nausea and vomiting.  Advised to follow-up with PCP for gastroenteritis.  At this time there does not appear to be any evidence of an acute emergency medical condition and the patient appears stable for discharge with appropriate outpatient follow up. Diagnosis was discussed with patient who verbalizes understanding of care plan and is agreeable to discharge. I have discussed return precautions with patient who verbalizes understanding of return precautions. Patient encouraged to follow-up  with their PCP. All questions answered.   Note: Portions of this report may have been transcribed using voice recognition software. Every effort was made to ensure accuracy; however, inadvertent computerized transcription errors may still be present.     James Fox 08/25/18 1710    James Rasmussen, MD 08/26/18 [phone]

## 2018-08-25 NOTE — ED Provider Notes (Cosign Needed)
[facility]-EMERGENCY DEPT Provider Note   CSN: [id number] Arrival date & time: 08/25/18  1236     History   Chief Complaint Chief Complaint  Patient presents with  . Abdominal Pain  . Emesis    HPI Britta MccreedyJason M Buras is a 38 y.o. male.     HPI Patient presents to the emergency department with nausea vomiting and diarrhea that started this morning.  The patient states that he is also have abdominal discomfort.  Patient states that he did not take any medications prior to arrival for his symptoms.  The patient denies chest pain, shortness of breath, headache,blurred vision, neck pain, fever, cough, weakness, numbness, dizziness, anorexia, edema, rash, back pain, dysuria, hematemesis, bloody stool, near syncope, or syncope. History reviewed. No pertinent past medical history.  There are no active problems to display for this patient.   Past Surgical History:  Procedure Laterality Date  . WISDOM TOOTH EXTRACTION          Home Medications    Prior to Admission medications   Medication Sig Start Date End Date Taking? Authorizing Provider  ondansetron (ZOFRAN ODT) 4 MG disintegrating tablet Take 1 tablet (4 mg total) by mouth every 8 (eight) hours as needed for nausea or vomiting. Patient not taking: Reported on 08/25/2018 10/13/17   Barkley BoardsMcDonald, Mia A, PA-C    Family History History reviewed. No pertinent family history.  Social History Social History   Tobacco Use  . Smoking status: Current Every Day Smoker    Packs/day: 0.50    Types: Cigarettes  . Smokeless tobacco: Never Used  Substance Use Topics  . Alcohol use: No  . Drug use: Yes    Types: Marijuana     Allergies   Amoxicillin   Review of Systems Review of Systems All other systems negative except as documented in the HPI. All pertinent positives and negatives as reviewed in the HPI.  Physical Exam Updated Vital Signs BP (!) 168/116 (BP Location: Right Arm)   Pulse 69   Temp 98 F  (36.7 C) (Oral)   Resp 19   SpO2 100%   Physical Exam Vitals signs and nursing note reviewed.  Constitutional:      General: He is not in acute distress.    Appearance: He is well-developed.  HENT:     Head: Normocephalic and atraumatic.  Eyes:     Pupils: Pupils are equal, round, and reactive to light.  Neck:     Musculoskeletal: Normal range of motion and neck supple.  Cardiovascular:     Rate and Rhythm: Normal rate and regular rhythm.     Heart sounds: Normal heart sounds. No murmur. No friction rub. No gallop.   Pulmonary:     Effort: Pulmonary effort is normal. No respiratory distress.     Breath sounds: Normal breath sounds. No wheezing.  Abdominal:     General: Bowel sounds are normal. There is no distension.     Palpations: Abdomen is soft.     Tenderness: There is generalized abdominal tenderness.  Skin:    General: Skin is warm and dry.     Capillary Refill: Capillary refill takes less than 2 seconds.     Findings: No erythema or rash.  Neurological:     Mental Status: He is alert and oriented to person, place, and time.     Motor: No abnormal muscle tone.     Coordination: Coordination normal.  Psychiatric:        Behavior: Behavior  normal.      ED Treatments / Results  Labs (all labs ordered are listed, but only abnormal results are displayed) Labs Reviewed  COMPREHENSIVE METABOLIC PANEL - Abnormal; Notable for the following components:      Result Value   CO2 20 (*)    Glucose, Bld 127 (*)    All other components within normal limits  CBC - Abnormal; Notable for the following components:   Platelets 146 (*)    All other components within normal limits  LIPASE, BLOOD  URINALYSIS, ROUTINE W REFLEX MICROSCOPIC    EKG    Radiology No results found.  Procedures Procedures (including critical care time)  Medications Ordered in ED Medications  sodium chloride flush (NS) 0.9 % injection 3 mL (has no administration in time range)  promethazine  (PHENERGAN) injection 25 mg (25 mg Intravenous Given 08/25/18 1454)  sodium chloride 0.9 % bolus 1,000 mL (1,000 mLs Intravenous New Bag/Given 08/25/18 1420)  morphine 4 MG/ML injection 4 mg (4 mg Intravenous Given 08/25/18 1454)     Initial Impression / Assessment and Plan / ED Course  I have reviewed the triage vital signs and the nursing notes.  Pertinent labs & imaging results that were available during my care of the patient were reviewed by me and considered in my medical decision making (see chart for details).        Patient most likely has gastroenteritis.  He states that he ate several different foods last night and is unsure if that was the cause.  Has been stable here in the emergency department will await the rest of his results.  Patient is received IV fluids along with Phenergan pain control.  His vital signs remained stable as well.  Final Clinical Impressions(s) / ED Diagnoses   Final diagnoses:  None    ED Discharge Orders    None       Dalia Heading, PA-C 08/25/18 1513

## 2018-08-25 NOTE — Discharge Instructions (Signed)
You have been diagnosed today with gastroenteritis.  At this time there does not appear to be the presence of an emergent medical condition, however there is always the potential for conditions to change. Please read and follow the below instructions.  Please return to the Emergency Department immediately for any new or worsening symptoms. Please be sure to follow up with your Primary Care Provider within one week regarding your visit today; please call their office to schedule an appointment even if you are feeling better for a follow-up visit.  You may follow-up with the [facility] if you do not already have a primary care provider. You may use the antinausea medication and Phenergan as prescribed to help with your symptoms.  Please be sure to drink plenty of water to avoid dehydration.  Return for any new or worsening symptoms.  Get help right away if: You have chest pain. You feel very weak or you pass out (faint). You see blood in your throw-up. Your throw-up looks like coffee grounds. You have bloody or black poop (stools) or poop that look like tar. You have a very bad headache, a stiff neck, or both. You have a rash. You have very bad pain, cramping, or bloating in your belly (abdomen). You have trouble breathing. You are breathing very quickly. Your heart is beating very quickly. Your skin feels cold and clammy. You feel confused. You have pain when you pee. You have signs of dehydration, such as: Dark pee, hardly any pee, or no pee. Cracked lips. Dry mouth. Sunken eyes. Sleepiness. Weakness.  Please read the additional information packets attached to your discharge summary.  Do not take your medicine if  develop an itchy rash, swelling in your mouth or lips, or difficulty breathing; call 911 and seek immediate emergency medical attention if this occurs.

## 2019-05-10 ENCOUNTER — Emergency Department (HOSPITAL_COMMUNITY)
Admission: EM | Admit: 2019-05-10 | Discharge: 2019-05-10 | Disposition: A | Discharge status: Home / Self Care | Payer: Self-pay | Attending: Emergency Medicine | Admitting: Emergency Medicine

## 2019-05-10 ENCOUNTER — Emergency Department (HOSPITAL_COMMUNITY): Payer: Self-pay

## 2019-05-10 ENCOUNTER — Encounter (HOSPITAL_COMMUNITY): Payer: Self-pay | Admitting: Emergency Medicine

## 2019-05-10 ENCOUNTER — Other Ambulatory Visit: Payer: Self-pay

## 2019-05-10 DIAGNOSIS — F17210 Nicotine dependence, cigarettes, uncomplicated: Secondary | ICD-10-CM | POA: Insufficient documentation

## 2019-05-10 DIAGNOSIS — X500XXA Overexertion from strenuous movement or load, initial encounter: Secondary | ICD-10-CM | POA: Insufficient documentation

## 2019-05-10 DIAGNOSIS — M79601 Pain in right arm: Secondary | ICD-10-CM | POA: Insufficient documentation

## 2019-05-10 DIAGNOSIS — I10 Essential (primary) hypertension: Secondary | ICD-10-CM | POA: Insufficient documentation

## 2019-05-10 HISTORY — DX: Essential (primary) hypertension: I10

## 2019-05-10 NOTE — Discharge Instructions (Signed)
It was nice meeting you today. Fortunately, your xray does not show any fracture of your forearm. Most likely, you strained the muscle in your forearm. This should improve shortly. A couple of things that might help are some ice and ibuprofen. I will also write you a work note for a couple of days to give it some time to heal.

## 2019-05-10 NOTE — ED Triage Notes (Signed)
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 Reports that noticed last night pain in left wrist and forearm area esp with rotation. Unsure if box fell on it yesterday but today when working unloading another truck pain was bad. Denies falls.

## 2019-05-10 NOTE — ED Provider Notes (Signed)
[facility]-EMERGENCY DEPT Provider Note   CSN: [id number] Arrival date & time: 05/10/19  3149     History Chief Complaint  Patient presents with  . Arm Pain    James Fox is a 39 y.o. male who is presenting for left forearm pain since yesterday.  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  He is unsure if a box might of fallen on top of it as well.  The pain is progressed since that time.  Is worse with flexion and wrist internal/external rotation.  He notes that he is able to have a full range of motion of his wrist notable and fingers and is able to put pressure on the wrist however it hurts.  He denies numbness or tingling in the hand.  Past Medical History:  Diagnosis Date  . Hypertension     There are no problems to display for this patient.   Past Surgical History:  Procedure Laterality Date  . WISDOM TOOTH EXTRACTION         No family history on file.  Social History   Tobacco Use  . Smoking status: Current Every Day Smoker    Packs/day: 0.50    Types: Cigarettes  . Smokeless tobacco: Never Used  Substance Use Topics  . Alcohol use: No  . Drug use: Yes    Types: Marijuana    Home Medications Prior to Admission medications   Medication Sig Start Date End Date Taking? Authorizing Provider  promethazine (PHENERGAN) 25 MG tablet Take 1 tablet (25 mg total) by mouth every 6 (six) hours as needed for nausea or vomiting. 08/25/18 05/10/19  Harlene Salts A, PA-C    Allergies    Amoxicillin  Review of Systems   Review of Systems  Constitutional: Negative.   HENT: Negative.   Eyes: Negative.   Respiratory: Negative.   Cardiovascular: Negative.   Gastrointestinal: Negative.   Genitourinary: Negative.   Musculoskeletal: Positive for arthralgias.  Skin: Negative.   Neurological: Negative.   Psychiatric/Behavioral: Negative.     Physical Exam Updated Vital Signs BP (!)  157/108 (BP Location: Right Arm)   Pulse 93   Temp 98.1 F (36.7 C) (Oral)   Resp 18   SpO2 100%   Physical Exam Constitutional:      General: He is not in acute distress. HENT:     Head: Atraumatic.  Cardiovascular:     Rate and Rhythm: Normal rate and regular rhythm.     Comments: Radial pulses intact bilaterally. Pulmonary:     Effort: Pulmonary effort is normal.     Breath sounds: Normal breath sounds.  Abdominal:     General: Bowel sounds are normal.  Musculoskeletal:     Cervical back: Neck supple.     Comments: Left upper extremity: no obvious deformity. Minimal swelling over proximal dorsal forearm. Minimal pain with palpation of this. No other tenderness to palpation over the shoulder or wrist including the snuff box. Normal ROM of the shoulder and elbow. Painful ROM of the wrist intact-primarily with internal/external rotation and extension.   Skin:    General: Skin is warm and dry.     Findings: No lesion.  Neurological:     General: No focal deficit present.     Mental Status: He is alert.     Sensory: No sensory deficit.  Psychiatric:        Mood and Affect: Mood normal.  ED Results / Procedures / Treatments   Labs (all labs ordered are listed, but only abnormal results are displayed) Labs Reviewed - No data to display  EKG None  Radiology DG Forearm Left  Result Date: 05/10/2019 CLINICAL DATA:  Pain after heavy lifting EXAM: LEFT FOREARM - 2 VIEW COMPARISON:  None. FINDINGS: Frontal and lateral views were obtained. No fracture or dislocation. Joint spaces appear normal. No erosive change. There is a spur arising from the coracoid process of the proximal ulna. There is a minus ulnar variance. IMPRESSION: No fracture or dislocation. No appreciable arthropathy. Coracoid process proximal ulnar spur. There is a minus ulnar variance. Electronically Signed   By: Lowella Grip III M.D.   On: 05/10/2019 11:11    Medications Ordered in ED Medications - No  data to display  ED Course  I have reviewed the triage vital signs and the nursing notes.  Pertinent labs & imaging results that were available during my care of the patient were reviewed by me and considered in my medical decision making (see chart for details).    MDM Rules/Calculators/A&P                      This is a 39 year old male who is presenting for left dorsal forearm pain since yesterday.  He is to work entails truck loading and unloading.  He is unsure but possibly had a box fall on his left forearm yesterday. Imaging negative for fracture or other acute deformity. Discussed the results with the patient along with conservative management at home.  Work note given until Monday. Stable for discharge. Final Clinical Impression(s) / ED Diagnoses Final diagnoses:  Right arm pain    Rx / DC Orders ED Discharge Orders    None       Mitzi Hansen, MD 05/10/19 [place], Wenda Overland, MD 05/11/19 2022

## 2019-06-05 ENCOUNTER — Other Ambulatory Visit: Payer: Self-pay

## 2019-06-05 ENCOUNTER — Encounter (HOSPITAL_COMMUNITY): Payer: Self-pay

## 2019-06-05 DIAGNOSIS — Y92410 Unspecified street and highway as the place of occurrence of the external cause: Secondary | ICD-10-CM | POA: Diagnosis not present

## 2019-06-05 DIAGNOSIS — M542 Cervicalgia: Secondary | ICD-10-CM | POA: Diagnosis not present

## 2019-06-05 DIAGNOSIS — F17210 Nicotine dependence, cigarettes, uncomplicated: Secondary | ICD-10-CM | POA: Diagnosis not present

## 2019-06-05 DIAGNOSIS — Y9389 Activity, other specified: Secondary | ICD-10-CM | POA: Diagnosis not present

## 2019-06-05 DIAGNOSIS — I10 Essential (primary) hypertension: Secondary | ICD-10-CM | POA: Diagnosis not present

## 2019-06-05 DIAGNOSIS — Y999 Unspecified external cause status: Secondary | ICD-10-CM | POA: Diagnosis not present
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
## 2019-06-06 ENCOUNTER — Emergency Department (HOSPITAL_COMMUNITY)
Admission: EM | Admit: 2019-06-06 | Discharge: 2019-06-06 | Disposition: A | Discharge status: Home / Self Care | Payer: No Typology Code available for payment source | Attending: Emergency Medicine | Admitting: Emergency Medicine

## 2019-06-06 DIAGNOSIS — M542 Cervicalgia: Secondary | ICD-10-CM

## 2019-06-06 MED ORDER — MELOXICAM 7.5 MG PO TABS: 15.0000 mg | ORAL_TABLET | Freq: Every day | ORAL | 0 refills | Status: DC

## 2019-06-06 MED ORDER — METHOCARBAMOL 500 MG PO TABS
500.0000 mg | ORAL_TABLET | Freq: Once | ORAL | Status: AC
  Administered 2019-06-06: 500 mg via ORAL
  Filled 2019-06-06: qty 1

## 2019-06-06 MED ORDER — MELOXICAM 15 MG PO TABS
15.0000 mg | ORAL_TABLET | Freq: Once | ORAL | Status: AC
  Administered 2019-06-06: 03:00:00 15 mg via ORAL
  Filled 2019-06-06: qty 1

## 2019-06-06 MED ORDER — METHOCARBAMOL 500 MG PO TABS: 500.0000 mg | ORAL_TABLET | Freq: Two times a day (BID) | ORAL | 0 refills | Status: DC

## 2019-06-06 NOTE — Discharge Instructions (Addendum)
Take the prescribed medication as directed--sent to pharmacy already.  Can use heat therapy on the neck to help with muscle soreness and stiffness. Follow-up with your primary care doctor. Return to the ED for new or worsening symptoms.

## 2019-06-06 NOTE — ED Notes (Signed)
Patient was verbalized discharge instructions. Pt had no further questions at this time. NAD. Unable to obtain signature due to topaz not working.
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
## 2019-08-31 ENCOUNTER — Ambulatory Visit (INDEPENDENT_AMBULATORY_CARE_PROVIDER_SITE_OTHER): Payer: Self-pay

## 2019-08-31 ENCOUNTER — Encounter (HOSPITAL_COMMUNITY): Payer: Self-pay

## 2019-08-31 ENCOUNTER — Other Ambulatory Visit: Payer: Self-pay

## 2019-08-31 ENCOUNTER — Ambulatory Visit (HOSPITAL_COMMUNITY)
Admission: EM | Admit: 2019-08-31 | Discharge: 2019-08-31 | Disposition: A | Discharge status: Home / Self Care | Payer: Self-pay | Attending: Emergency Medicine | Admitting: Emergency Medicine

## 2019-08-31 DIAGNOSIS — M79674 Pain in right toe(s): Secondary | ICD-10-CM

## 2019-08-31 MED ORDER — IBUPROFEN 800 MG PO TABS: 800.0000 mg | ORAL_TABLET | Freq: Three times a day (TID) | ORAL | 0 refills | Status: DC

## 2019-08-31 NOTE — Discharge Instructions (Addendum)
No signs of healing fracutre Use anti-inflammatories for pain/swelling. You may take up to 800 mg Ibuprofen every 8 hours with food. You may supplement Ibuprofen with Tylenol [phone] mg every 8 hours.  Ice and elevate Avoid shoes applying pressure to toe temporarily if possible  Follow up if not improving with consistent use of anti-inflammatories x 2 weeks

## 2019-08-31 NOTE — ED Triage Notes (Signed)
Pt states he banged his right 5th toe on something a month ago and he has been having pain in it since. Pt states he can't wear shoes, because of the pain. Pt c/o 4/10 pain in toe. Pt has 1+ swelling of the toe.

## 2019-08-31 NOTE — ED Provider Notes (Signed)
[facility]    CSN: [id number] Arrival date & time: 08/31/19  1421      History   Chief Complaint Chief Complaint  Patient presents with  . Toe Injury    HPI James Fox is a 39 y.o. male history of hypertension presenting today for evaluation of fifth toe pain.  Patient reports approximately 1 month ago he stubbed his toe on his right foot.  Since he has had persistent pain.  Reports continued discomfort especially with wearing closed toe shoes and applying pressure to the outer aspect of the toe.  He denies any numbness or tingling, denies burning sensation.  Denies history of diabetes.  Denies any pain extending into the dorsum of foot.  Has not taken any medicines for pain.  HPI  Past Medical History:  Diagnosis Date  . Hypertension     There are no problems to display for this patient.   Past Surgical History:  Procedure Laterality Date  . WISDOM TOOTH EXTRACTION         Home Medications    Prior to Admission medications   Medication Sig Start Date End Date Taking? Authorizing Provider  ibuprofen (ADVIL) 800 MG tablet Take 1 tablet (800 mg total) by mouth 3 (three) times daily. 08/31/19   Nivek Powley C, PA-C  promethazine (PHENERGAN) 25 MG tablet Take 1 tablet (25 mg total) by mouth every 6 (six) hours as needed for nausea or vomiting. 08/25/18 05/10/19  Deliah Boston, PA-C    Family History No family history on file.  Social History Social History   Tobacco Use  . Smoking status: Current Every Day Smoker    Packs/day: 0.50    Types: Cigarettes  . Smokeless tobacco: Never Used  Substance Use Topics  . Alcohol use: No  . Drug use: Yes    Types: Marijuana     Allergies   Amoxicillin   Review of Systems Review of Systems  Constitutional: Negative for fatigue and fever.  Eyes: Negative for redness, itching and visual disturbance.  Respiratory: Negative for shortness of breath.   Cardiovascular: Negative for chest pain and  leg swelling.  Gastrointestinal: Negative for nausea and vomiting.  Musculoskeletal: Positive for arthralgias. Negative for myalgias.  Skin: Negative for color change, rash and wound.  Neurological: Negative for dizziness, syncope, weakness, light-headedness and headaches.     Physical Exam Triage Vital Signs ED Triage Vitals  Enc Vitals Group     BP 08/31/19 1508 (!) 148/92     Pulse Rate 08/31/19 1508 89     Resp 08/31/19 1508 16     Temp 08/31/19 1508 98.1 F (36.7 C)     Temp Source 08/31/19 1508 Oral     SpO2 08/31/19 1508 99 %     Weight 08/31/19 1509 202 lb (91.6 kg)     Height 08/31/19 1509 5\' 9"  (1.753 m)     Head Circumference --      Peak Flow --      Pain Score 08/31/19 1509 4     Pain Loc --      Pain Edu? --      Excl. in [place]? --    No data found.  Updated Vital Signs BP (!) 148/92   Pulse 89   Temp 98.1 F (36.7 C) (Oral)   Resp 16   Ht 5\' 9"  (1.753 m)   Wt 202 lb (91.6 kg)   SpO2 99%   BMI 29.83 kg/m   Visual Acuity  Right Eye Distance:   Left Eye Distance:   Bilateral Distance:    Right Eye Near:   Left Eye Near:    Bilateral Near:     Physical Exam Vitals and nursing note reviewed.  Constitutional:      Appearance: He is well-developed.     Comments: No acute distress  HENT:     Head: Normocephalic and atraumatic.     Nose: Nose normal.  Eyes:     Conjunctiva/sclera: Conjunctivae normal.  Cardiovascular:     Rate and Rhythm: Normal rate.  Pulmonary:     Effort: Pulmonary effort is normal. No respiratory distress.  Abdominal:     General: There is no distension.  Musculoskeletal:        General: Normal range of motion.     Cervical back: Neck supple.     Comments: Right foot: No obvious swelling or deformity, no discoloration or erythema, no significant tenderness to palpation to fifth toe Dorsum of foot without tenderness to palpation, dorsalis pedis 2+  Skin:    General: Skin is warm and dry.  Neurological:     Mental Status:  He is alert and oriented to person, place, and time.      UC Treatments / Results  Labs (all labs ordered are listed, but only abnormal results are displayed) Labs Reviewed - No data to display  EKG   Radiology DG Foot Complete Right  Result Date: 08/31/2019 CLINICAL DATA:  Pain and sensitivity to the fifth toe EXAM: RIGHT FOOT COMPLETE - 3+ VIEW COMPARISON:  None. FINDINGS: There is no evidence of fracture or dislocation. There is no evidence of arthropathy or other focal bone abnormality. Soft tissues are unremarkable. IMPRESSION: Negative. Electronically Signed   By: Jasmine Pang M.D.   On: 08/31/2019 16:06    Procedures Procedures (including critical care time)  Medications Ordered in UC Medications - No data to display  Initial Impression / Assessment and Plan / UC Course  I have reviewed the triage vital signs and the nursing notes.  Pertinent labs & imaging results that were available during my care of the patient were reviewed by me and considered in my medical decision making (see chart for details).     X-ray negative for acute bony abnormality or healing fracture.  Recommending continued use of anti-inflammatories and avoiding shoes that apply pressure on toes.  Continue to monitor,Discussed strict return precautions. Patient verbalized understanding and is agreeable with plan.  Final Clinical Impressions(s) / UC Diagnoses   Final diagnoses:  Pain of toe of right foot     Discharge Instructions     No signs of healing fracutre Use anti-inflammatories for pain/swelling. You may take up to 800 mg Ibuprofen every 8 hours with food. You may supplement Ibuprofen with Tylenol [phone] mg every 8 hours.  Ice and elevate Avoid shoes applying pressure to toe temporarily if possible  Follow up if not improving with consistent use of anti-inflammatories x 2 weeks   ED Prescriptions    Medication Sig Dispense Auth. Provider   ibuprofen (ADVIL) 800 MG tablet Take 1  tablet (800 mg total) by mouth 3 (three) times daily. 21 tablet Calen Posch, [place] C, PA-C     PDMP not reviewed this encounter.   Lew Dawes, [place] 08/31/19 1656

## 2019-12-19 ENCOUNTER — Other Ambulatory Visit: Payer: Medicaid Other

## 2019-12-19 DIAGNOSIS — Z20822 Contact with and (suspected) exposure to covid-19: Secondary | ICD-10-CM

## 2019-12-20 LAB — SARS-COV-2, NAA 2 DAY TAT

## 2019-12-20 LAB — NOVEL CORONAVIRUS, NAA: SARS-CoV-2, NAA: NOT DETECTED

## 2021-01-05 ENCOUNTER — Emergency Department (HOSPITAL_BASED_OUTPATIENT_CLINIC_OR_DEPARTMENT_OTHER)
Admission: EM | Admit: 2021-01-05 | Discharge: 2021-01-05 | Disposition: A | Discharge status: Home / Self Care | Payer: Medicaid Other

## 2021-04-23 ENCOUNTER — Other Ambulatory Visit: Payer: Self-pay

## 2021-04-23 ENCOUNTER — Ambulatory Visit (HOSPITAL_COMMUNITY)
Admission: EM | Admit: 2021-04-23 | Discharge: 2021-04-23 | Disposition: A | Discharge status: Home / Self Care | Payer: BC Managed Care – PPO | Attending: Emergency Medicine | Admitting: Emergency Medicine

## 2021-04-23 ENCOUNTER — Encounter (HOSPITAL_COMMUNITY): Payer: Self-pay

## 2021-04-23 DIAGNOSIS — A084 Viral intestinal infection, unspecified: Secondary | ICD-10-CM

## 2021-04-23 DIAGNOSIS — I10 Essential (primary) hypertension: Secondary | ICD-10-CM | POA: Diagnosis not present

## 2021-04-23 LAB — BASIC METABOLIC PANEL
Anion gap: 12 (ref 5–15)
BUN: 14 mg/dL (ref 6–20)
CO2: 24 mmol/L (ref 22–32)
Calcium: 10.2 mg/dL (ref 8.9–10.3)
Chloride: 102 mmol/L (ref 98–111)
Creatinine, Ser: 1.17 mg/dL (ref 0.61–1.24)
GFR, Estimated: >60 mL/min (ref >60)
Glucose, Bld: 106 mg/dL — ABNORMAL HIGH (ref 70–99)
Potassium: 3.6 mmol/L (ref 3.5–5.1)
Sodium: 138 mmol/L (ref 135–145)

## 2021-04-23 MED ORDER — HYDROCHLOROTHIAZIDE 12.5 MG PO TABS: 12.5000 mg | ORAL_TABLET | Freq: Every day | ORAL | 3 refills | Status: DC

## 2021-04-23 NOTE — ED Triage Notes (Signed)
Pt presents for nausea and vomiting x 1-2 days. He think he had food poison however he feels better. Patient would like a note for work.

## 2021-04-23 NOTE — Discharge Instructions (Addendum)
Gradually add back solid foods, adding spicy, high fat or fried, or dairy foods last.  Make sure to drink lots of liquids to rehydrate yourself.  If you do not have a primary care provider, and you want to find one at [facility], go to [email or web address], click on "primary care," click on "new patients: schedule online," and choose an appointment place and time that fits your schedule in either family medicine or internal medicine.   Start the blood pressure medicine in a week after you are feeling all better.

## 2021-04-24 NOTE — ED Provider Notes (Signed)
[facility]    CSN: [id number] Arrival date & time: 04/23/21  1340      History   Chief Complaint Chief Complaint  Patient presents with   Nausea    VOMITING.    HPI James Fox is a 41 y.o. male.  Patient with nausea, vomiting, and diarrhea.  Thinks he has had food poisoning but now feels better.  He reports no vomiting since the afternoon of 04/22/2021.  Diarrhea has improved.  Reports sore muscles in his abdomen from vomiting, otherwise does not have abdominal pain.  He has not been drinking sufficient liquids; thinks he may be dehydrated.  Is now able to eat and drink however, and does not feel he needs nausea medicine.  He does need a note for work if he is okay to return  Patient also interested in care for his blood pressure has never been told he has a diagnosis of hypertension however he has hypertension listed in his past medical history in his epic medical record.  Patient reports every time he is seen in healthcare, his blood pressure is elevated he is told he needs to do something about it.  He would like to get started on medication for his hypertension and he would like a referral to a PCP.   HPI  Past Medical History:  Diagnosis Date   Hypertension     There are no problems to display for this patient.   Past Surgical History:  Procedure Laterality Date   WISDOM TOOTH EXTRACTION         Home Medications    Prior to Admission medications   Medication Sig Start Date End Date Taking? Authorizing Provider  hydrochlorothiazide (HYDRODIURIL) 12.5 MG tablet Take 1 tablet (12.5 mg total) by mouth daily. 04/23/21  Yes Cathlyn Parsons, NP  ibuprofen (ADVIL) 800 MG tablet Take 1 tablet (800 mg total) by mouth 3 (three) times daily. 08/31/19   Wieters, Hallie C, PA-C  promethazine (PHENERGAN) 25 MG tablet Take 1 tablet (25 mg total) by mouth every 6 (six) hours as needed for nausea or vomiting. 08/25/18 05/10/19  Bill Salinas, PA-C    Family  History History reviewed. No pertinent family history.  Social History Social History   Tobacco Use   Smoking status: Former    Packs/day: 0.50    Types: Cigarettes   Smokeless tobacco: Never  Substance Use Topics   Alcohol use: No   Drug use: Yes    Types: Marijuana     Allergies   Amoxicillin   Review of Systems Review of Systems  Constitutional:  Negative for chills and fever.  Cardiovascular:  Negative for chest pain, palpitations and leg swelling.  Gastrointestinal:  Positive for abdominal pain, diarrhea, nausea and vomiting.  Neurological:  Negative for dizziness, syncope and headaches.    Physical Exam Triage Vital Signs ED Triage Vitals  Enc Vitals Group     BP 04/23/21 1459 (!) 149/107     Pulse Rate 04/23/21 1458 96     Resp 04/23/21 1458 16     Temp 04/23/21 1459 98.6 F (37 C)     Temp Source 04/23/21 1458 Oral     SpO2 04/23/21 1458 100 %     Weight --      Height --      Head Circumference --      Peak Flow --      Pain Score 04/23/21 1459 4     Pain Loc --  Pain Edu? --      Excl. in [place]? --    No data found.  Updated Vital Signs BP (!) 149/107 (BP Location: Left Arm)    Pulse (!) 105    Temp 98.6 F (37 C) (Oral)    Resp 16    SpO2 100%   Visual Acuity Right Eye Distance:   Left Eye Distance:   Bilateral Distance:    Right Eye Near:   Left Eye Near:    Bilateral Near:     Physical Exam Constitutional:      General: He is not in acute distress.    Appearance: Normal appearance.  Neck:     Vascular: No carotid bruit.  Cardiovascular:     Rate and Rhythm: Normal rate and regular rhythm.  Pulmonary:     Effort: Pulmonary effort is normal.     Breath sounds: Normal breath sounds.  Abdominal:     General: Abdomen is flat. Bowel sounds are normal.     Tenderness: There is no abdominal tenderness. There is no guarding or rebound.  Musculoskeletal:     Right lower leg: No edema.     Left lower leg: No edema.  Neurological:      Mental Status: He is alert.     UC Treatments / Results  Labs (all labs ordered are listed, but only abnormal results are displayed) Labs Reviewed  BASIC METABOLIC PANEL - Abnormal; Notable for the following components:      Result Value   Glucose, Bld 106 (*)    All other components within normal limits    EKG   Radiology No results found.  Procedures Procedures (including critical care time)  Medications Ordered in UC Medications - No data to display  Initial Impression / Assessment and Plan / UC Course  I have reviewed the triage vital signs and the nursing notes.  Pertinent labs & imaging results that were available during my care of the patient were reviewed by me and considered in my medical decision making (see chart for details).     BMP within normal limits except glucose 106; labs were not fasting.  I prescribed hydrochlorothiazide at a low dose because upon review of his previous healthcare encounters, patient's blood pressure generally in the 140s/90s range.  I advised patient to drink increased fluids for the next couple of days to rehydrate himself after his GI illness and to not start the hydrochlorothiazide until next week when he was completely recovered from illness.  I demonstrated how to use open scheduling via [email or web address] to find a new PCP appointment.  Final Clinical Impressions(s) / UC Diagnoses   Final diagnoses:  Viral gastroenteritis  Essential hypertension     Discharge Instructions      Gradually add back solid foods, adding spicy, high fat or fried, or dairy foods last.  Make sure to drink lots of liquids to rehydrate yourself.  If you do not have a primary care provider, and you want to find one at [facility], go to [email or web address], click on "primary care," click on "new patients: schedule online," and choose an appointment place and time that fits your schedule in either family medicine or internal medicine.   Start the blood  pressure medicine in a week after you are feeling all better.    ED Prescriptions     Medication Sig Dispense Auth. Provider   hydrochlorothiazide (HYDRODIURIL) 12.5 MG tablet Take 1 tablet (12.5 mg total) by mouth daily. 30 tablet Kire Ferg,  Marzella Schlein, NP      PDMP not reviewed this encounter.   Cathlyn Parsons, NP 04/24/21 [phone]

## 2021-05-31 DIAGNOSIS — M5459 Other low back pain: Secondary | ICD-10-CM | POA: Diagnosis not present

## 2021-08-06 IMAGING — DX DG FOOT COMPLETE 3+V*R*
3 series · 3 of 3 positions shown · non-contrast
Comparison: None.

CLINICAL DATA: Pain and sensitivity to the fifth toe

EXAM:
RIGHT FOOT COMPLETE - 3+ VIEW

[foot ap]
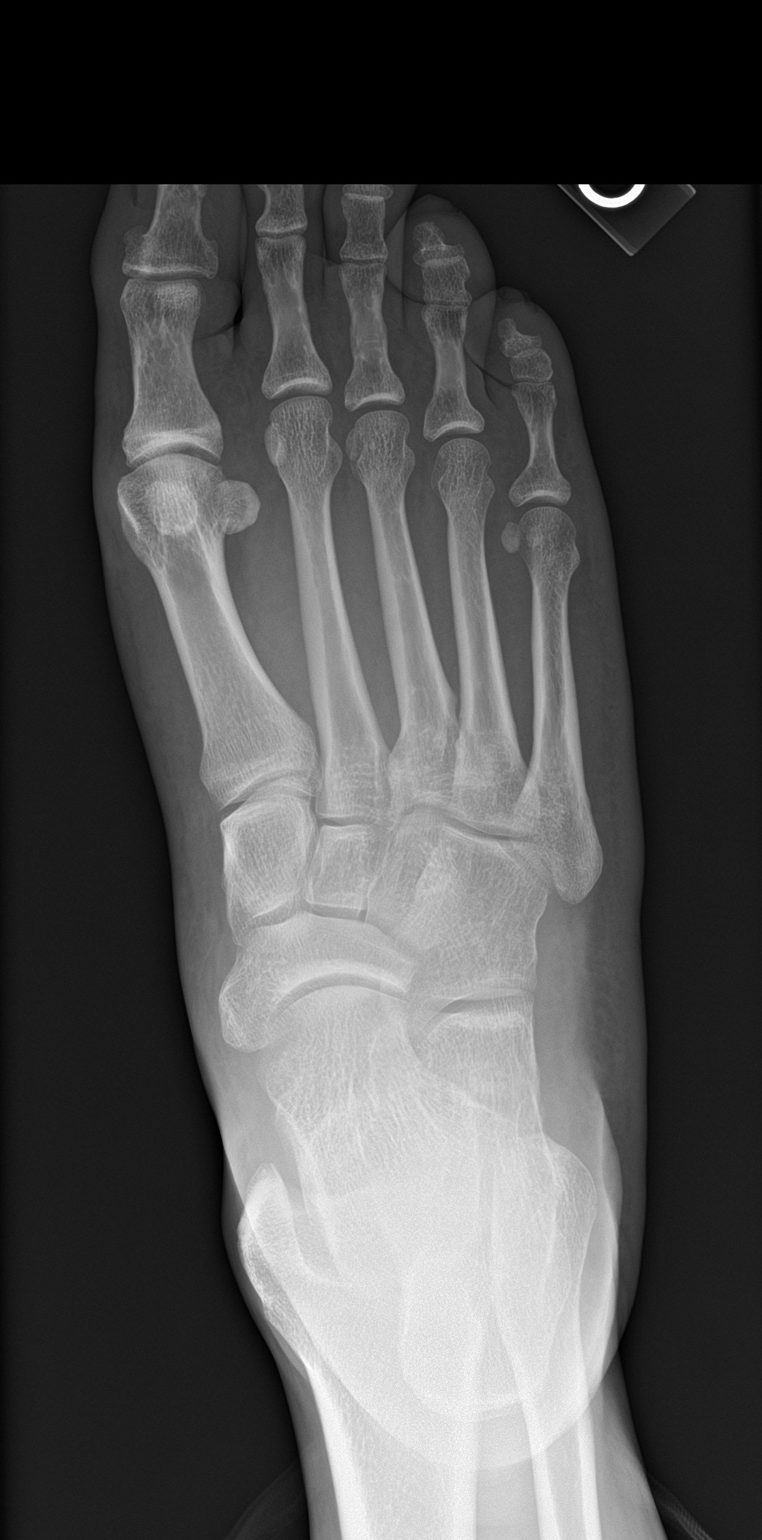

[foot obl]
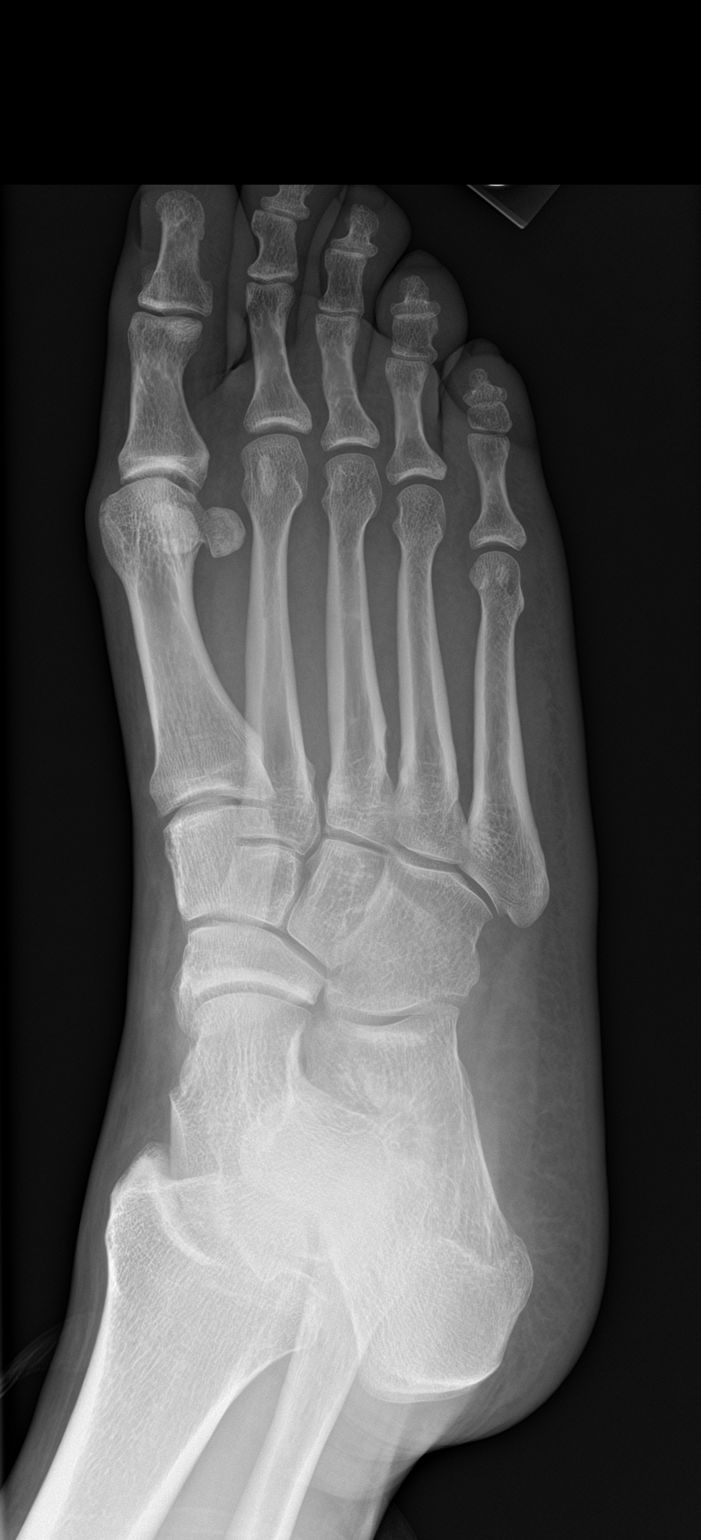

[foot lat]
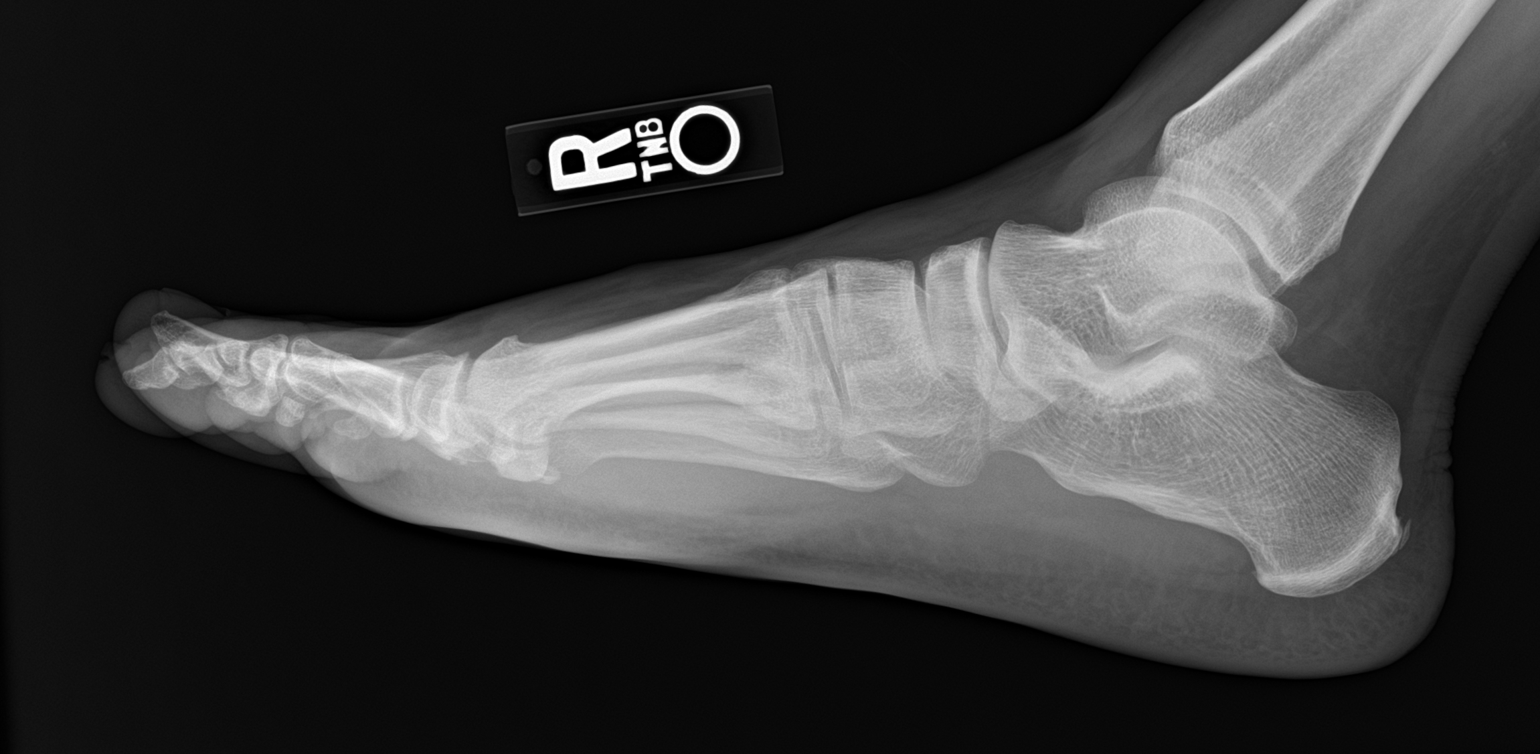

[3 of 3 positions shown; findings below may reference images not displayed]

FINDINGS: There is no evidence of fracture or dislocation. There is no
evidence of arthropathy or other focal bone abnormality. Soft
tissues are unremarkable.
IMPRESSION: Negative.

## 2021-11-28 ENCOUNTER — Ambulatory Visit (HOSPITAL_COMMUNITY)
Admission: EM | Admit: 2021-11-28 | Discharge: 2021-11-28 | Disposition: A | Discharge status: Home / Self Care | Payer: BC Managed Care – PPO | Attending: Family Medicine | Admitting: Family Medicine

## 2021-11-28 ENCOUNTER — Encounter (HOSPITAL_COMMUNITY): Payer: Self-pay

## 2021-11-28 DIAGNOSIS — J069 Acute upper respiratory infection, unspecified: Secondary | ICD-10-CM | POA: Diagnosis not present

## 2021-11-28 NOTE — ED Provider Notes (Signed)
[facility]    CSN: [id number] Arrival date & time: 11/28/21  1025      History   Chief Complaint Chief Complaint  Patient presents with   Cough   Nasal Congestion    HPI James Fox is a 41 y.o. male.    Cough  Here with a history of cough, nasal congestion, and some myalgia.  Symptoms began the evening of September 13th.  He has not had any fever or chills.  No nausea or vomiting or diarrhea.  No dyspnea or wheezing. He had a little sore throat at the first but that is resolved.  He did a home COVID test yesterday that was negative.  He is using some Tessalon and TheraFlu and overall they have been helpful   Past Medical History:  Diagnosis Date   Hypertension     There are no problems to display for this patient.   Past Surgical History:  Procedure Laterality Date   WISDOM TOOTH EXTRACTION         Home Medications    Prior to Admission medications   Medication Sig Start Date End Date Taking? Authorizing Provider  hydrochlorothiazide (HYDRODIURIL) 12.5 MG tablet Take 1 tablet (12.5 mg total) by mouth daily. 04/23/21   Cathlyn Parsons, NP  promethazine (PHENERGAN) 25 MG tablet Take 1 tablet (25 mg total) by mouth every 6 (six) hours as needed for nausea or vomiting. 08/25/18 05/10/19  Bill Salinas, PA-C    Family History History reviewed. No pertinent family history.  Social History Social History   Tobacco Use   Smoking status: Former    Packs/day: 0.50    Types: Cigarettes   Smokeless tobacco: Never  Substance Use Topics   Alcohol use: No   Drug use: Yes    Types: Marijuana     Allergies   Amoxicillin   Review of Systems Review of Systems  Respiratory:  Positive for cough.      Physical Exam Triage Vital Signs ED Triage Vitals [11/28/21 1227]  Enc Vitals Group     BP (!) 142/93     Pulse Rate 71     Resp 16     Temp 98.5 F (36.9 C)     Temp Source Oral     SpO2 98 %     Weight      Height      Head  Circumference      Peak Flow      Pain Score      Pain Loc      Pain Edu?      Excl. in [place]?    No data found.  Updated Vital Signs BP (!) 142/93 (BP Location: Left Arm)   Pulse 71   Temp 98.5 F (36.9 C) (Oral)   Resp 16   SpO2 98%   Visual Acuity Right Eye Distance:   Left Eye Distance:   Bilateral Distance:    Right Eye Near:   Left Eye Near:    Bilateral Near:     Physical Exam Vitals reviewed.  Constitutional:      General: He is not in acute distress.    Appearance: He is not toxic-appearing.  HENT:     Right Ear: Tympanic membrane and ear canal normal.     Left Ear: Tympanic membrane and ear canal normal.     Nose: Nose normal.     Mouth/Throat:     Mouth: Mucous membranes are moist.  Pharynx: No oropharyngeal exudate or posterior oropharyngeal erythema.  Eyes:     Extraocular Movements: Extraocular movements intact.     Conjunctiva/sclera: Conjunctivae normal.     Pupils: Pupils are equal, round, and reactive to light.  Cardiovascular:     Rate and Rhythm: Normal rate and regular rhythm.     Heart sounds: No murmur heard. Pulmonary:     Effort: Pulmonary effort is normal.     Breath sounds: No stridor. No wheezing, rhonchi or rales.  Musculoskeletal:     Cervical back: Neck supple.  Lymphadenopathy:     Cervical: No cervical adenopathy.  Skin:    Capillary Refill: Capillary refill takes less than 2 seconds.     Coloration: Skin is not jaundiced or pale.  Neurological:     General: No focal deficit present.     Mental Status: He is alert and oriented to person, place, and time.  Psychiatric:        Behavior: Behavior normal.      UC Treatments / Results  Labs (all labs ordered are listed, but only abnormal results are displayed) Labs Reviewed - No data to display  EKG   Radiology No results found.  Procedures Procedures (including critical care time)  Medications Ordered in UC Medications - No data to display  Initial  Impression / Assessment and Plan / UC Course  I have reviewed the triage vital signs and the nursing notes.  Pertinent labs & imaging results that were available during my care of the patient were reviewed by me and considered in my medical decision making (see chart for details).        He will continue his home over-the-counter medications.  I do not see the need to redo his COVID swab as he is already starting to improve. Final Clinical Impressions(s) / UC Diagnoses   Final diagnoses:  Viral upper respiratory tract infection     Discharge Instructions      Continue your current home medications that you are using for symptom relief.     ED Prescriptions   None    PDMP not reviewed this encounter.   Zenia Resides, MD 11/28/21 [id number]

## 2021-11-28 NOTE — Discharge Instructions (Addendum)
Continue your current home medications that you are using for symptom relief.

## 2021-11-28 NOTE — ED Triage Notes (Signed)
Pt presents to office for cough and nasal congestion x 2 days. Pt is requesting a note.

## 2021-12-31 ENCOUNTER — Ambulatory Visit: Payer: BC Managed Care – PPO | Admitting: Family Medicine

## 2022-03-17 ENCOUNTER — Encounter (HOSPITAL_BASED_OUTPATIENT_CLINIC_OR_DEPARTMENT_OTHER): Payer: Self-pay

## 2022-03-17 ENCOUNTER — Other Ambulatory Visit: Payer: Self-pay

## 2022-03-17 ENCOUNTER — Emergency Department (HOSPITAL_BASED_OUTPATIENT_CLINIC_OR_DEPARTMENT_OTHER)
Admission: EM | Admit: 2022-03-17 | Discharge: 2022-03-17 | Disposition: A | Discharge status: Home / Self Care | Payer: BC Managed Care – PPO | Attending: Emergency Medicine | Admitting: Emergency Medicine

## 2022-03-17 DIAGNOSIS — M25551 Pain in right hip: Secondary | ICD-10-CM | POA: Insufficient documentation

## 2022-03-17 DIAGNOSIS — Y92410 Unspecified street and highway as the place of occurrence of the external cause: Secondary | ICD-10-CM | POA: Insufficient documentation

## 2022-03-17 DIAGNOSIS — M25511 Pain in right shoulder: Secondary | ICD-10-CM | POA: Insufficient documentation

## 2022-03-17 DIAGNOSIS — M542 Cervicalgia: Secondary | ICD-10-CM | POA: Diagnosis not present

## 2022-03-17 MED ORDER — METHOCARBAMOL 500 MG PO TABS: 500.0000 mg | ORAL_TABLET | Freq: Two times a day (BID) | ORAL | 0 refills | Status: DC
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
## 2022-10-07 ENCOUNTER — Encounter: Payer: Self-pay | Admitting: Family

## 2022-10-07 ENCOUNTER — Ambulatory Visit: Payer: BC Managed Care – PPO | Admitting: Family

## 2022-10-07 VITALS — BP 160/100 | HR 85 | Temp 97.8°F | Resp 18 | Ht 69.0 in | Wt 200.0 lb

## 2022-10-07 DIAGNOSIS — F17290 Nicotine dependence, other tobacco product, uncomplicated: Secondary | ICD-10-CM

## 2022-10-07 DIAGNOSIS — Z1211 Encounter for screening for malignant neoplasm of colon: Secondary | ICD-10-CM

## 2022-10-07 DIAGNOSIS — Z13220 Encounter for screening for lipoid disorders: Secondary | ICD-10-CM

## 2022-10-07 DIAGNOSIS — Z113 Encounter for screening for infections with a predominantly sexual mode of transmission: Secondary | ICD-10-CM

## 2022-10-07 DIAGNOSIS — I10 Essential (primary) hypertension: Secondary | ICD-10-CM | POA: Diagnosis not present

## 2022-10-07 DIAGNOSIS — Z23 Encounter for immunization: Secondary | ICD-10-CM

## 2022-10-07 DIAGNOSIS — R350 Frequency of micturition: Secondary | ICD-10-CM | POA: Diagnosis not present

## 2022-10-07 DIAGNOSIS — H6123 Impacted cerumen, bilateral: Secondary | ICD-10-CM

## 2022-10-07 DIAGNOSIS — Z125 Encounter for screening for malignant neoplasm of prostate: Secondary | ICD-10-CM

## 2022-10-07 DIAGNOSIS — Z1159 Encounter for screening for other viral diseases: Secondary | ICD-10-CM

## 2022-10-07 MED ORDER — HYDROCHLOROTHIAZIDE 12.5 MG PO TABS: 12.5000 mg | ORAL_TABLET | Freq: Every day | ORAL | 1 refills | Status: DC

## 2022-10-07 NOTE — Progress Notes (Signed)
Provider: Richarda Blade FNP-C   James Fox, James Citrin, NP  Patient Care Team: James Fox, James Citrin, NP as PCP - General (Family Medicine)  Extended Emergency Contact Information Primary Emergency Contact: Fox,James Mobile Phone: [phone] Relation: Friend Preferred language: English Interpreter needed? No Secondary Emergency Contact: Fox,James Mobile Phone: [phone] Relation: Sister  Code Status:  Full Code  Goals of care: Advanced Directive information    10/07/2022    9:04 AM  Advanced Directives  Does Patient Have a Medical Advance Directive? No  Would patient like information on creating a medical advance directive? No - Patient declined     Chief Complaint  Patient presents with   Establish Care    New patient here to establish care, patient would likee to discuss BP and prostate concerns   Quality Metric Gaps    Patient is due for a hep C screening, HIV screening and covid booster at this time    HPI:  Pt is a 42 y.o. male seen today establish care here at [facility]  care for medical management of chronic diseases.  Has ongoing chronic medical history of essential hypertension. States B/p has been high since he was in the [employer].states was given B/p med in the ER which worked but has not taken for several years since he had no PCP.   Also complains of urine urgency and frequency.Frequency worst at night 4-6 times.   Drinks alcohol socially.  Quit smoking cigarettes in 2021.Had smoked since he was 42 yrs old. Current Vapes  Also smokes marijuana  Does exercise by doing calisthetics x 4/week.  Past Medical History:  Diagnosis Date   Hypertension    Past Surgical History:  Procedure Laterality Date   WISDOM TOOTH EXTRACTION      Allergies  Allergen Reactions   Amoxicillin Hives    Has patient had a PCN reaction causing immediate rash, facial/tongue/throat swelling, SOB or lightheadedness with hypotension: Unknown Has patient  had a PCN reaction causing severe rash involving mucus membranes or skin necrosis: Unknown Has patient had a PCN reaction that required hospitalization: Unknown Has patient had a PCN reaction occurring within the last 10 years: Unknown If all of the above answers are "NO", then may proceed with Cephalosporin use.    Allergies as of 10/07/2022       Reactions   Amoxicillin Hives   Has patient had a PCN reaction causing immediate rash, facial/tongue/throat swelling, SOB or lightheadedness with hypotension: Unknown Has patient had a PCN reaction causing severe rash involving mucus membranes or skin necrosis: Unknown Has patient had a PCN reaction that required hospitalization: Unknown Has patient had a PCN reaction occurring within the last 10 years: Unknown If all of the above answers are "NO", then may proceed with Cephalosporin use.        Medication List        Accurate as of October 07, 2022 10:39 AM. If you have any questions, ask your nurse or doctor.          STOP taking these medications    methocarbamol 500 MG tablet Commonly known as: ROBAXIN Stopped by: Arihant Pennings C Amisha Pospisil       TAKE these medications    hydrochlorothiazide 12.5 MG tablet Commonly known as: HYDRODIURIL Take 1 tablet (12.5 mg total) by mouth daily.        Review of Systems  Constitutional:  Negative for appetite change, chills, fatigue, fever and unexpected weight change.  HENT:  Negative for congestion, dental  problem, ear discharge, ear pain, facial swelling, hearing loss, nosebleeds, postnasal drip, rhinorrhea, sinus pressure, sinus pain, sneezing, sore throat, tinnitus and trouble swallowing.   Eyes:  Positive for visual disturbance. Negative for pain, discharge, redness and itching.       Blurry vision not following up with ophthalmology  Respiratory:  Negative for cough, chest tightness, shortness of breath and wheezing.   Cardiovascular:  Negative for chest pain, palpitations and leg  swelling.  Gastrointestinal:  Negative for abdominal distention, abdominal pain, blood in stool, constipation, diarrhea, nausea and vomiting.  Endocrine: Negative for cold intolerance, heat intolerance, polydipsia, polyphagia and polyuria.  Genitourinary:  Positive for frequency and urgency. Negative for difficulty urinating, dysuria and flank pain.       Weak stream   Musculoskeletal:  Negative for arthralgias, back pain, gait problem, joint swelling, myalgias, neck pain and neck stiffness.  Skin:  Negative for color change, pallor, rash and wound.  Neurological:  Negative for dizziness, syncope, speech difficulty, weakness, light-headedness, numbness and headaches.  Hematological:  Does not bruise/bleed easily.  Psychiatric/Behavioral:  Negative for agitation, behavioral problems, confusion, hallucinations, self-injury, sleep disturbance and suicidal ideas. The patient is nervous/anxious.        Increased stress level at work tries to exercise and listen to music and smoking. Had depression in the past no medication     Immunization History  Administered Date(s) Administered   Tdap 11/18/2017   Pertinent  Health Maintenance Due  Topic Date Due   INFLUENZA VACCINE  10/15/2022      06/05/2019   11:48 PM 08/31/2019    3:11 PM 04/23/2021    3:01 PM 03/17/2022    5:05 PM 10/07/2022    9:04 AM  Fall Risk  Falls in the past year?     0  Was there an injury with Fall?     0  Fall Risk Category Calculator     0  (RETIRED) Patient Fall Risk Level Low fall risk Low fall risk Low fall risk Low fall risk   Patient at Risk for Falls Due to     No Fall Risks  Fall risk Follow up     Falls evaluation completed   Functional Status Survey:    Vitals:   10/07/22 0906 10/07/22 0957  BP: (!) 160/100 (!) 160/100  Pulse:  85  Resp:  18  Temp:  97.8 F (36.6 C)  SpO2:  97%  Weight:  200 lb (90.7 kg)  Height: 5\' 9"  (1.753 m) 5\' 9"  (1.753 m)   Body mass index is 29.53 kg/m. Physical  Exam Vitals reviewed.  Constitutional:      General: He is not in acute distress.    Appearance: Normal appearance. He is overweight. He is not ill-appearing or diaphoretic.  HENT:     Head: Normocephalic.     Right Ear: Tympanic membrane, ear canal and external ear normal. There is no impacted cerumen.     Left Ear: Tympanic membrane, ear canal and external ear normal. There is no impacted cerumen.     Nose: Nose normal. No congestion or rhinorrhea.     Mouth/Throat:     Mouth: Mucous membranes are moist.     Pharynx: Oropharynx is clear. No oropharyngeal exudate or posterior oropharyngeal erythema.  Eyes:     General: No scleral icterus.       Right eye: No discharge.        Left eye: No discharge.     Extraocular Movements: Extraocular  movements intact.     Conjunctiva/sclera: Conjunctivae normal.     Pupils: Pupils are equal, round, and reactive to light.  Neck:     Vascular: No carotid bruit.  Cardiovascular:     Rate and Rhythm: Normal rate and regular rhythm.     Pulses: Normal pulses.     Heart sounds: Normal heart sounds. No murmur heard.    No friction rub. No gallop.  Pulmonary:     Effort: Pulmonary effort is normal. No respiratory distress.     Breath sounds: Normal breath sounds. No wheezing, rhonchi or rales.  Chest:     Chest wall: No tenderness.  Abdominal:     General: Bowel sounds are normal. There is no distension.     Palpations: Abdomen is soft. There is no mass.     Tenderness: There is no abdominal tenderness. There is no right CVA tenderness, left CVA tenderness, guarding or rebound.  Musculoskeletal:        General: No swelling or tenderness. Normal range of motion.     Cervical back: Normal range of motion. No rigidity or tenderness.     Right lower leg: No edema.     Left lower leg: No edema.  Lymphadenopathy:     Cervical: No cervical adenopathy.  Skin:    General: Skin is warm and dry.     Coloration: Skin is not pale.     Findings: No  bruising, erythema, lesion or rash.  Neurological:     Mental Status: He is alert and oriented to person, place, and time.     Cranial Nerves: No cranial nerve deficit.     Sensory: No sensory deficit.     Motor: No weakness.     Coordination: Coordination normal.     Gait: Gait normal.  Psychiatric:        Mood and Affect: Mood normal.        Speech: Speech normal.        Behavior: Behavior normal.        Thought Content: Thought content normal.        Judgment: Judgment normal.     Labs reviewed: No results for input(s): "NA", "K", "CL", "CO2", "GLUCOSE", "BUN", "CREATININE", "CALCIUM", "MG", "PHOS" in the last 8760 hours. No results for input(s): "AST", "ALT", "ALKPHOS", "BILITOT", "PROT", "ALBUMIN" in the last 8760 hours. No results for input(s): "WBC", "NEUTROABS", "HGB", "HCT", "MCV", "PLT" in the last 8760 hours. No results found for: "TSH" No results found for: "HGBA1C" No results found for: "CHOL", "HDL", "LDLCALC", "LDLDIRECT", "TRIG", "CHOLHDL"  Significant Diagnostic Results in last 30 days:  No results found.  Assessment/Plan  1. Uncontrolled hypertension Has  been out of his blood pressure medication for several years. - will restart hydrochlorothiazide as below   - Advised to check Blood pressure at home and record on log provided and notify provider if B/p > 140/90  - follow up in 2 weeks to evaluate  - hydrochlorothiazide (HYDRODIURIL) 12.5 MG tablet; Take 1 tablet (12.5 mg total) by mouth daily.  Dispense: 90 tablet; Refill: 1 - TSH; Future - COMPLETE METABOLIC PANEL WITH GFR; Future - CBC with Differential/Platelet; Future  2. Colon cancer screening Reports his father recently died from colon cancer ?unclear Prostate  -Cologuard verse colonoscopy discussed,prefers colonoscopy.Made aware GI office will call him to schedule appointment  - Ambulatory referral to Gastroenterology  3. Vaping nicotine dependence, tobacco product Vaping cessation advised    4. Screening for hyperlipidemia Dietary modification and  exercise advised  - Lipid panel; Future  5. Prostate cancer screening Unclear if his father died recently due to colon verse prostate cancer.request screening due to frequent void at night with weak urine stream  - PSA, Total and Free; Future  6. Screen for STD (sexually transmitted disease) - HIV Antibody (routine testing w rflx); Future  7. Encounter for hepatitis C screening test for low risk patient Low risk  - Hepatitis C antibody; Future  8. Urinary frequency Afebrile  Negative exam  - will rule out UTI - Urine Culture  9. Need for Tdap vaccination Afebrile  - Tdap vaccine administered today by CMA no reaction reported.   - Tdap vaccine greater than or equal to 7yo IM  10. Bilateral impacted cerumen - Advised to Instill debrox 6.5 otic solution 5 drops into each ear twice daily x 4 days then follow up for ear lavage.May apply cotton ball at bedtime to prevent drainage to pillow. - follow up in 2 weeks for bilateral ear lavage   Family/ staff Communication: Reviewed plan of care with patient verbalized understanding   Labs/tests ordered:   - Urine Culture - Hepatitis C antibody; Future - HIV Antibody (routine testing w rflx); Future - PSA, Total and Free; Future - TSH; Future - COMPLETE METABOLIC PANEL WITH GFR; Future - CBC with Differential/Platelet; Future  Next Appointment : Return in about 2 weeks (around 10/21/2022) for Blood pressure check and bila.ear lavage . Fasting labs in the morning.   James Bookman, NP

## 2022-10-07 NOTE — Patient Instructions (Addendum)
-   Advised to check Blood pressure at home and record on log provided and notify provider if B/p > 140/90   - Instill debrox 6.5 otic solution 5 drops into each ear twice daily x 4 days then follow up for ear lavage.May apply cotton ball at bedtime to prevent drainage to pillow.

## 2022-10-08 ENCOUNTER — Other Ambulatory Visit: Payer: BC Managed Care – PPO

## 2022-10-08 DIAGNOSIS — Z113 Encounter for screening for infections with a predominantly sexual mode of transmission: Secondary | ICD-10-CM | POA: Diagnosis not present

## 2022-10-08 DIAGNOSIS — I10 Essential (primary) hypertension: Secondary | ICD-10-CM

## 2022-10-08 DIAGNOSIS — Z13220 Encounter for screening for lipoid disorders: Secondary | ICD-10-CM

## 2022-10-08 DIAGNOSIS — Z125 Encounter for screening for malignant neoplasm of prostate: Secondary | ICD-10-CM

## 2022-10-08 DIAGNOSIS — Z136 Encounter for screening for cardiovascular disorders: Secondary | ICD-10-CM | POA: Diagnosis not present

## 2022-10-08 DIAGNOSIS — Z1159 Encounter for screening for other viral diseases: Secondary | ICD-10-CM | POA: Diagnosis not present

## 2022-10-08 LAB — CBC WITH DIFFERENTIAL/PLATELET
Absolute Monocytes: 501 cells/uL (ref 200–950)
Basophils Absolute: 22 cells/uL (ref 0–200)
Basophils Relative: 0.4 %
Eosinophils Absolute: 209 cells/uL (ref 15–500)
Eosinophils Relative: 3.8 %
HCT: 42.8 % (ref 38.5–50.0)
Hemoglobin: 14.1 g/dL (ref 13.2–17.1)
Lymphs Abs: 1249 cells/uL (ref 850–3900)
MCH: 26.8 pg — ABNORMAL LOW (ref 27.0–33.0)
MCHC: 32.9 g/dL (ref 32.0–36.0)
MCV: 81.4 fL (ref 80.0–100.0)
MPV: 10 fL (ref 7.5–12.5)
Neutro Abs: 3520 cells/uL (ref 1500–7800)
Neutrophils Relative %: 64 %
Platelets: 203 10*3/uL (ref 140–400)
RBC: 5.26 10*6/uL (ref 4.20–5.80)
RDW: 14 % (ref 11.0–15.0)
Total Lymphocyte: 22.7 %
WBC: 5.5 10*3/uL (ref 3.8–10.8)

## 2022-10-08 LAB — URINE CULTURE
MICRO NUMBER:: 15241749
Result:: NO GROWTH
SPECIMEN QUALITY:: ADEQUATE

## 2022-10-09 LAB — LIPID PANEL: HDL: 71 mg/dL (ref >=40)

## 2022-10-09 LAB — CBC WITH DIFFERENTIAL/PLATELET: Monocytes Relative: 9.1 %

## 2022-10-21 ENCOUNTER — Encounter: Payer: Self-pay | Admitting: Family

## 2022-10-21 ENCOUNTER — Ambulatory Visit (INDEPENDENT_AMBULATORY_CARE_PROVIDER_SITE_OTHER): Payer: BC Managed Care – PPO | Admitting: Family

## 2022-10-21 VITALS — BP 130/78 | HR 76 | Temp 97.3°F | Resp 18 | Ht 69.0 in | Wt 202.0 lb

## 2022-10-21 DIAGNOSIS — I10 Essential (primary) hypertension: Secondary | ICD-10-CM

## 2022-10-21 DIAGNOSIS — H6123 Impacted cerumen, bilateral: Secondary | ICD-10-CM
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
## 2023-01-14 ENCOUNTER — Encounter: Payer: Self-pay | Admitting: Family

## 2023-01-14 ENCOUNTER — Ambulatory Visit (INDEPENDENT_AMBULATORY_CARE_PROVIDER_SITE_OTHER): Payer: BC Managed Care – PPO | Admitting: Family

## 2023-01-14 VITALS — BP 142/88 | HR 84 | Resp 20 | Ht 69.0 in | Wt 213.4 lb

## 2023-01-14 DIAGNOSIS — G43001 Migraine without aura, not intractable, with status migrainosus: Secondary | ICD-10-CM | POA: Diagnosis not present

## 2023-01-14 DIAGNOSIS — I10 Essential (primary) hypertension: Secondary | ICD-10-CM | POA: Diagnosis not present

## 2023-01-14 MED ORDER — EXCEDRIN EXTRA STRENGTH 250-250-65 MG PO TABS: 1.0000 | ORAL_TABLET | Freq: Three times a day (TID) | ORAL | 0 refills | Status: AC | PRN
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
## 2023-04-02 ENCOUNTER — Other Ambulatory Visit: Payer: Self-pay | Admitting: Family

## 2023-04-02 DIAGNOSIS — I10 Essential (primary) hypertension: Secondary | ICD-10-CM

## 2023-04-26 ENCOUNTER — Other Ambulatory Visit: Payer: BC Managed Care – PPO

## 2023-04-27 ENCOUNTER — Encounter: Payer: Self-pay | Admitting: Family

## 2023-04-28 ENCOUNTER — Encounter: Payer: Self-pay | Admitting: Family

## 2023-04-28 ENCOUNTER — Ambulatory Visit (INDEPENDENT_AMBULATORY_CARE_PROVIDER_SITE_OTHER): Payer: BC Managed Care – PPO | Admitting: Family

## 2023-04-28 VITALS — BP 130/84 | HR 85 | Temp 98.7°F | Ht 69.0 in | Wt 215.8 lb

## 2023-04-28 DIAGNOSIS — H6122 Impacted cerumen, left ear: Secondary | ICD-10-CM

## 2023-04-28 DIAGNOSIS — Z2821 Immunization not carried out because of patient refusal: Secondary | ICD-10-CM

## 2023-04-28 DIAGNOSIS — K219 Gastro-esophageal reflux disease without esophagitis: Secondary | ICD-10-CM | POA: Diagnosis not present

## 2023-04-28 DIAGNOSIS — R0981 Nasal congestion: Secondary | ICD-10-CM | POA: Diagnosis not present

## 2023-04-28 DIAGNOSIS — I10 Essential (primary) hypertension: Secondary | ICD-10-CM | POA: Diagnosis not present

## 2023-04-28 DIAGNOSIS — R350 Frequency of micturition: Secondary | ICD-10-CM | POA: Diagnosis not present

## 2023-04-28 DIAGNOSIS — R0681 Apnea, not elsewhere classified: Secondary | ICD-10-CM

## 2023-04-28 LAB — POCT URINALYSIS DIPSTICK
Bilirubin, UA: NEGATIVE
Blood, UA: NEGATIVE
Glucose, UA: NEGATIVE
Ketones, UA: NEGATIVE
Leukocytes, UA: NEGATIVE
Nitrite, UA: NEGATIVE
Protein, UA: NEGATIVE
Spec Grav, UA: 1.01 (ref 1.010–1.025)
Urobilinogen, UA: 0.2 U/dL
pH, UA: 7.5 (ref 5.0–8.0)

## 2023-04-29 DIAGNOSIS — Z2821 Immunization not carried out because of patient refusal: Secondary | ICD-10-CM | POA: Insufficient documentation

## 2023-04-29 DIAGNOSIS — K219 Gastro-esophageal reflux disease without esophagitis: Secondary | ICD-10-CM | POA: Insufficient documentation
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
## 2023-05-13 ENCOUNTER — Ambulatory Visit: Payer: BC Managed Care – PPO | Admitting: Family

## 2023-05-17 ENCOUNTER — Encounter: Payer: BC Managed Care – PPO | Admitting: Family

## 2023-05-23 NOTE — Progress Notes (Signed)
   This encounter was created in error - please disregard. No show

## 2023-10-26 ENCOUNTER — Encounter: Payer: BC Managed Care – PPO | Admitting: Family

## 2023-11-07 NOTE — Progress Notes (Signed)
   This encounter was created in error - please disregard. No show

## 2024-01-08 ENCOUNTER — Other Ambulatory Visit: Payer: Self-pay | Admitting: Family

## 2024-01-08 DIAGNOSIS — I10 Essential (primary) hypertension: Secondary | ICD-10-CM
